# Patient Record
Sex: Female | Born: 2000 | Race: White | Hispanic: No | Marital: Single | Smoking: Never smoker
Health system: Southern US, Community
[De-identification: ages and names within clinical notes are randomized; demographics above are authoritative.]

## PROBLEM LIST (undated history)

## (undated) DIAGNOSIS — N390 Urinary tract infection, site not specified: Secondary | ICD-10-CM

## (undated) DIAGNOSIS — F988 Other specified behavioral and emotional disorders with onset usually occurring in childhood and adolescence: Secondary | ICD-10-CM

## (undated) DIAGNOSIS — F909 Attention-deficit hyperactivity disorder, unspecified type: Secondary | ICD-10-CM

## (undated) HISTORY — PX: KNEE SURGERY: SHX244

## (undated) HISTORY — DX: Other specified behavioral and emotional disorders with onset usually occurring in childhood and adolescence: F98.8

## (undated) HISTORY — PX: TONSILLECTOMY: SUR1361

---
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
## 2019-02-16 ENCOUNTER — Encounter: Payer: Self-pay | Admitting: *Deleted

## 2019-02-17 ENCOUNTER — Ambulatory Visit (INDEPENDENT_AMBULATORY_CARE_PROVIDER_SITE_OTHER): Payer: Managed Care, Other (non HMO) | Admitting: Advanced Practice Midwife

## 2019-02-17 ENCOUNTER — Other Ambulatory Visit: Payer: Self-pay

## 2019-02-17 ENCOUNTER — Other Ambulatory Visit: Payer: Managed Care, Other (non HMO)

## 2019-02-17 ENCOUNTER — Encounter: Payer: Self-pay | Admitting: *Deleted

## 2019-02-17 ENCOUNTER — Other Ambulatory Visit (HOSPITAL_COMMUNITY)
Admission: RE | Admit: 2019-02-17 | Discharge: 2019-02-17 | Disposition: A | Discharge status: Home / Self Care | Payer: Managed Care, Other (non HMO) | Source: Ambulatory Visit | Attending: Advanced Practice Midwife | Admitting: Advanced Practice Midwife

## 2019-02-17 ENCOUNTER — Encounter: Payer: Self-pay | Admitting: Advanced Practice Midwife

## 2019-02-17 VITALS — BP 92/62 | HR 78 | Temp 98.5°F | Wt 135.0 lb

## 2019-02-17 DIAGNOSIS — Z3481 Encounter for supervision of other normal pregnancy, first trimester: Secondary | ICD-10-CM

## 2019-02-17 DIAGNOSIS — Z3A12 12 weeks gestation of pregnancy: Secondary | ICD-10-CM

## 2019-02-17 DIAGNOSIS — O99611 Diseases of the digestive system complicating pregnancy, first trimester: Secondary | ICD-10-CM

## 2019-02-17 DIAGNOSIS — O219 Vomiting of pregnancy, unspecified: Secondary | ICD-10-CM | POA: Diagnosis not present

## 2019-02-17 DIAGNOSIS — Z362 Encounter for other antenatal screening follow-up: Secondary | ICD-10-CM

## 2019-02-17 DIAGNOSIS — Z3401 Encounter for supervision of normal first pregnancy, first trimester: Secondary | ICD-10-CM | POA: Insufficient documentation

## 2019-02-17 DIAGNOSIS — K5900 Constipation, unspecified: Secondary | ICD-10-CM

## 2019-02-17 DIAGNOSIS — Z3400 Encounter for supervision of normal first pregnancy, unspecified trimester: Secondary | ICD-10-CM | POA: Insufficient documentation

## 2019-02-17 MED ORDER — POLYETHYLENE GLYCOL 3350 17 GM/SCOOP PO POWD: 17.0000 g | Freq: Every day | ORAL | 0 refills | Status: DC

## 2019-02-17 MED ORDER — DOCUSATE SODIUM 100 MG PO CAPS: 100.0000 mg | ORAL_CAPSULE | Freq: Every day | ORAL | 2 refills | Status: DC | PRN

## 2019-02-17 MED ORDER — METOCLOPRAMIDE HCL 10 MG PO TABS: 10.0000 mg | ORAL_TABLET | Freq: Four times a day (QID) | ORAL | 2 refills | Status: DC | PRN

## 2019-02-17 NOTE — Patient Instructions (Signed)
Safe Medications in Pregnancy   Acne: Benzoyl Peroxide Salicylic Acid  Backache/Headache: Tylenol: 2 regular strength every 4 hours OR              2 Extra strength every 6 hours  Colds/Coughs/Allergies: Benadryl (alcohol free) 25 mg every 6 hours as needed Breath right strips Claritin Cepacol throat lozenges Chloraseptic throat spray Cold-Eeze- up to three times per day Cough drops, alcohol free Flonase (by prescription only) Guaifenesin Mucinex Robitussin DM (plain only, alcohol free) Saline nasal spray/drops Sudafed (pseudoephedrine) & Actifed ** use only after [redacted] weeks gestation and if you do not have high blood pressure Tylenol Vicks Vaporub Zinc lozenges Zyrtec   Constipation: Colace Ducolax suppositories Fleet enema Glycerin suppositories Metamucil Milk of magnesia Miralax Senokot Smooth move tea  Diarrhea: Kaopectate Imodium A-D  *NO pepto Bismol  Hemorrhoids: Anusol Anusol HC Preparation H Tucks  Indigestion: Tums Maalox Mylanta Zantac  Pepcid  Insomnia: Benadryl (alcohol free) 25mg  every 6 hours as needed Tylenol PM Unisom, no Gelcaps  Leg Cramps: Tums MagGel  Nausea/Vomiting:  Bonine Dramamine Emetrol Ginger extract Sea bands Meclizine  Nausea medication to take during pregnancy:  Unisom (doxylamine succinate 25 mg tablets) Take one tablet daily at bedtime. If symptoms are not adequately controlled, the dose can be increased to a maximum recommended dose of two tablets daily (1/2 tablet in the morning, 1/2 tablet mid-afternoon and one at bedtime). Vitamin B6 100mg  tablets. Take one tablet twice a day (up to 200 mg per day).  Skin Rashes: Aveeno products Benadryl cream or 25mg  every 6 hours as needed Calamine Lotion 1% cortisone cream  Yeast infection: Gyne-lotrimin 7 Monistat 7   **If taking multiple medications, please check labels to avoid duplicating the same active ingredients **take medication as directed on  the label ** Do not exceed 4000 mg of tylenol in 24 hours **Do not take medications that contain aspirin or ibuprofen   First Trimester of Pregnancy The first trimester of pregnancy is from week 1 until the end of week 13 (months 1 through 3). A week after a sperm fertilizes an egg, the egg will implant on the wall of the uterus. This embryo will begin to develop into a baby. Genes from you and your partner will form the baby. The female genes will determine whether the baby will be a boy or a girl. At 6-8 weeks, the eyes and face will be formed, and the heartbeat can be seen on ultrasound. At the end of 12 weeks, all the baby's organs will be formed. Now that you are pregnant, you will want to do everything you can to have a healthy baby. Two of the most important things are to get good prenatal care and to follow your health care provider's instructions. Prenatal care is all the medical care you receive before the baby's birth. This care will help prevent, find, and treat any problems during the pregnancy and childbirth. Body changes during your first trimester Your body goes through many changes during pregnancy. The changes vary from woman to woman.  You may gain or lose a couple of pounds at first.  You may feel sick to your stomach (nauseous) and you may throw up (vomit). If the vomiting is uncontrollable, call your health care provider.  You may tire easily.  You may develop headaches that can be relieved by medicines. All medicines should be approved by your health care provider.  You may urinate more often. Painful urination may mean you have a bladder  a bladder infection. °· You may develop heartburn as a result of your pregnancy. °· You may develop constipation because certain hormones are causing the muscles that push stool through your intestines to slow down. °· You may develop hemorrhoids or swollen veins (varicose veins). °· Your breasts may begin to grow larger and become tender. Your  nipples may stick out more, and the tissue that surrounds them (areola) may become darker. °· Your gums may bleed and may be sensitive to brushing and flossing. °· Dark spots or blotches (chloasma, mask of pregnancy) may develop on your face. This will likely fade after the baby is born. °· Your menstrual periods will stop. °· You may have a loss of appetite. °· You may develop cravings for certain kinds of food. °· You may have changes in your emotions from day to day, such as being excited to be pregnant or being concerned that something may go wrong with the pregnancy and baby. °· You may have more vivid and strange dreams. °· You may have changes in your hair. These can include thickening of your hair, rapid growth, and changes in texture. Some women also have hair loss during or after pregnancy, or hair that feels dry or thin. Your hair will most likely return to normal after your baby is born. °What to expect at prenatal visits °During a routine prenatal visit: °· You will be weighed to make sure you and the baby are growing normally. °· Your blood pressure will be taken. °· Your abdomen will be measured to track your baby's growth. °· The fetal heartbeat will be listened to between weeks 10 and 14 of your pregnancy. °· Test results from any previous visits will be discussed. °Your health care provider may ask you: °· How you are feeling. °· If you are feeling the baby move. °· If you have had any abnormal symptoms, such as leaking fluid, bleeding, severe headaches, or abdominal cramping. °· If you are using any tobacco products, including cigarettes, chewing tobacco, and electronic cigarettes. °· If you have any questions. °Other tests that may be performed during your first trimester include: °· Blood tests to find your blood type and to check for the presence of any previous infections. The tests will also be used to check for low iron levels (anemia) and protein on red blood cells (Rh antibodies).  Depending on your risk factors, or if you previously had diabetes during pregnancy, you may have tests to check for high blood sugar that affects pregnant women (gestational diabetes). °· Urine tests to check for infections, diabetes, or protein in the urine. °· An ultrasound to confirm the proper growth and development of the baby. °· Fetal screens for spinal cord problems (spina bifida) and Down syndrome. °· HIV (human immunodeficiency virus) testing. Routine prenatal testing includes screening for HIV, unless you choose not to have this test. °· You may need other tests to make sure you and the baby are doing well. °Follow these instructions at home: °Medicines °· Follow your health care provider's instructions regarding medicine use. Specific medicines may be either safe or unsafe to take during pregnancy. °· Take a prenatal vitamin that contains at least 600 micrograms (mcg) of folic acid. °· If you develop constipation, try taking a stool softener if your health care provider approves. °Eating and drinking ° °· Eat a balanced diet that includes fresh fruits and vegetables, whole grains, good sources of protein such as meat, eggs, or tofu, and low-fat dairy. Your health   will help you determine the amount of weight gain that is right for you.  Avoid raw meat and uncooked cheese. These carry germs that can cause birth defects in the baby.  Eating four or five small meals rather than three large meals a day may help relieve nausea and vomiting. If you start to feel nauseous, eating a few soda crackers can be helpful. Drinking liquids between meals, instead of during meals, also seems to help ease nausea and vomiting.  Limit foods that are high in fat and processed sugars, such as fried and sweet foods.  To prevent constipation: ? Eat foods that are high in fiber, such as fresh fruits and vegetables, whole grains, and beans. ? Drink enough fluid to keep your urine clear or pale yellow. Activity   Exercise only as directed by your health care provider. Most women can continue their usual exercise routine during pregnancy. Try to exercise for 30 minutes at least 5 days a week. Exercising will help you: ? Control your weight. ? Stay in shape. ? Be prepared for labor and delivery.  Experiencing pain or cramping in the lower abdomen or lower back is a good sign that you should stop exercising. Check with your health care provider before continuing with normal exercises.  Try to avoid standing for long periods of time. Move your legs often if you must stand in one place for a long time.  Avoid heavy lifting.  Wear low-heeled shoes and practice good posture.  You may continue to have sex unless your health care provider tells you not to. Relieving pain and discomfort  Wear a good support bra to relieve breast tenderness.  Take warm sitz baths to soothe any pain or discomfort caused by hemorrhoids. Use hemorrhoid cream if your health care provider approves.  Rest with your legs elevated if you have leg cramps or low back pain.  If you develop varicose veins in your legs, wear support hose. Elevate your feet for 15 minutes, 3-4 times a day. Limit salt in your diet. Prenatal care  Schedule your prenatal visits by the twelfth week of pregnancy. They are usually scheduled monthly at first, then more often in the last 2 months before delivery.  Write down your questions. Take them to your prenatal visits.  Keep all your prenatal visits as told by your health care provider. This is important. Safety  Wear your seat belt at all times when driving.  Make a list of emergency phone numbers, including numbers for family, friends, the hospital, and police and fire departments. General instructions  Ask your health care provider for a referral to a local prenatal education class. Begin classes no later than the beginning of month 6 of your pregnancy.  Ask for help if you have counseling or  nutritional needs during pregnancy. Your health care provider can offer advice or refer you to specialists for help with various needs.  Do not use hot tubs, steam rooms, or saunas.  Do not douche or use tampons or scented sanitary pads.  Do not cross your legs for long periods of time.  Avoid cat litter boxes and soil used by cats. These carry germs that can cause birth defects in the baby and possibly loss of the fetus by miscarriage or stillbirth.  Avoid all smoking, herbs, alcohol, and medicines not prescribed by your health care provider. Chemicals in these products affect the formation and growth of the baby.  Do not use any products that contain nicotine or tobacco,  such as cigarettes and e-cigarettes. If you need help quitting, ask your health care provider. You may receive counseling support and other resources to help you quit.  Schedule a dentist appointment. At home, brush your teeth with a soft toothbrush and be gentle when you floss. Contact a health care provider if:  You have dizziness.  You have mild pelvic cramps, pelvic pressure, or nagging pain in the abdominal area.  You have persistent nausea, vomiting, or diarrhea.  You have a bad smelling vaginal discharge.  You have pain when you urinate.  You notice increased swelling in your face, hands, legs, or ankles.  You are exposed to fifth disease or chickenpox.  You are exposed to [place] measles (rubella) and have never had it. Get help right away if:  You have a fever.  You are leaking fluid from your vagina.  You have spotting or bleeding from your vagina.  You have severe abdominal cramping or pain.  You have rapid weight gain or loss.  You vomit blood or material that looks like coffee grounds.  You develop a severe headache.  You have shortness of breath.  You have any kind of trauma, such as from a fall or a car accident. Summary  The first trimester of pregnancy is from week 1 until the  end of week 13 (months 1 through 3).  Your body goes through many changes during pregnancy. The changes vary from woman to woman.  You will have routine prenatal visits. During those visits, your health care provider will examine you, discuss any test results you may have, and talk with you about how you are feeling. This information is not intended to replace advice given to you by your health care provider. Make sure you discuss any questions you have with your health care provider. Document Released: 03/13/2001 Document Revised: 03/01/2017 Document Reviewed: 02/29/2016 Elsevier Patient Education  2020 [employer]

## 2019-02-17 NOTE — Progress Notes (Signed)
Bedside U/S shows single IUP with FHT of 160 BPM and HC measures 6.84cm  GA [redacted]W[redacted]D

## 2019-02-17 NOTE — Progress Notes (Signed)
Subjective:   Donna Huang is a 18 y.o. G1P0 at [redacted]w[redacted]d by 10week [place], being seen today for her first obstetrical visit.  Patient has no obsterical history and  has Patellar dislocation; Attention deficit disorder (ADD) without hyperactivity; Dysmenorrhea; and Supervision of normal first pregnancy on their problem list. Pregnancy history fully reviewed  Today patient reports doing well over all but with some intermittent nausea and vomiting that are improving at this time. She is able to take her prenatal vitamins without difficulty but would like some PRN Rx antiemetic.  Patient also reports mild pruritus around her left nipple and areola with redness when itching but without warmth, discharge, pain, fever, or a lump.      Patient Active Problem List   Diagnosis Date Noted  . Supervision of normal first pregnancy 02/17/2019  . 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 Dysmenorrhea 02/10/2016  . Patellar dislocation 03/04/2014    HISTORY: OB History  Gravida Para Term Preterm AB Living  1 0 0 0 0 0  SAB TAB Ectopic Multiple Live Births  0 0 0 0 0    # Outcome Date GA Lbr Len/2nd Weight Sex Delivery Anes PTL Lv  1 Current            Past Medical History:  Diagnosis Date  . ADD (attention deficit disorder)   . ADHD (attention deficit hyperactivity disorder)    Past Surgical History:  Procedure Laterality Date  . KNEE SURGERY Left   . TONSILLECTOMY     History reviewed. No pertinent family history. Social History   Tobacco Use  . Smoking status: Never Smoker  . Smokeless tobacco: Never Used  Substance Use Topics  . Alcohol use: No  . Drug use: No   No Known Allergies Current Outpatient Medications on File Prior to Visit  Medication Sig Dispense Refill  . Prenatal Vit-Fe Fumarate-FA (PRENATAL VITAMINS PO) Take 1 capsule by mouth daily.     No current facility-administered medications on file prior to visit.     Indications for ASA  therapy (per uptodate) : Patient has no indications One of the following: Previous pregnancy with preeclampsia, especially early onset and with an adverse outcome No Multifetal gestation No Chronic hypertension No Type 1 or 2 diabetes mellitus No Chronic kidney disease No Autoimmune disease (antiphospholipid syndrome, systemic lupus erythematosus) No  Two or more of the following: Nulliparity No Obesity (body mass index >30 kg/m2) No Family history of preeclampsia in mother or sister No Age ?35 years No Sociodemographic characteristics (African American race, low socioeconomic level) No Personal risk factors (eg, previous pregnancy with low birth weight or small for gestational age infant, previous adverse pregnancy outcome [eg, stillbirth], interval >10 years between pregnancies) No  Indications for early 1 hour GTT (per uptodate)  BMI >25 (>23 in Asian women) AND one of the following  Gestational diabetes mellitus in a previous pregnancy No Glycated hemoglobin ?5.7 percent (39 mmol/mol), impaired glucose tolerance, or impaired fasting glucose on previous testing No First-degree relative with diabetes No High-risk race/ethnicity (eg, African American, Latino, Native Amerdfsfeican, Asian American, Pacific Islander) No History of cardiovascular disease No Hypertension or on therapy for hypertension No High-density lipoprotein cholesterol level <35 mg/dL (0.90 mmol/L) and/or a triglyceride level >250 mg/dL (2.82 mmol/L) No Polycystic ovary syndrome No Physical inactivity No Other clinical condition associated with insulin resistance (eg, severe obesity, acanthosis nigricans) No Previous birth of an infant weighing ?4000 g No Previous stillbirth of unknown  cause No Exam   Vitals:   02/17/19 0814  BP: 92/62  Pulse: 78  Temp: 98.5 F (36.9 C)  Weight: 61.2 kg   Fetal Heart Rate (bpm): 160  System: General: well-developed, well-nourished female in no acute distress   Breast:   normal appearance, no masses or tenderness   Skin: normal coloration and turgor, no rashes   Neurologic: oriented, normal, negative, normal mood   Extremities: normal strength, tone, and muscle mass, ROM of all joints is normal   HEENT PERRLA, extraocular movement intact and sclera clear, anicteric   Mouth/Teeth mucous membranes moist, pharynx normal without lesions and dental hygiene good   Neck supple and no masses   Cardiovascular: regular rate and rhythm   Respiratory:  no respiratory distress, normal breath sounds   Abdomen: soft, non-tender; bowel sounds normal; no masses,  no organomegaly     Assessment:   Pregnancy: G1P0 Patient Active Problem List   Diagnosis Date Noted  . Supervision of normal first pregnancy 02/17/2019  . 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 Dysmenorrhea 02/10/2016  . Patellar dislocation 03/04/2014     Plan:  1. Encounter for supervision of normal first pregnancy in first trimester - Obstetric panel - HIV antibody (with reflex) - Culture, OB Urine - GC/Chlamydia probe amp ([place])not at [facility] - [place] bedside; Future - Babyscripts Schedule Optimization - Panorama or Harmony  2. Nausea and vomiting during pregnancy prior to [redacted] weeks gestation Improving per patient, no symptoms today. - metoCLOPramide (REGLAN) 10 MG tablet; Take 1 tablet (10 mg total) by mouth every 6 (six) hours as needed for nausea.  Dispense: 30 tablet; Refill: 2  3. Constipation during pregnancy in first trimester Discussed appropriate diet changes such as increasing water and natural fiber intake.  - polyethylene glycol powder (GLYCOLAX/MIRALAX) 17 GM/SCOOP powder; Take 17 g by mouth daily.  Dispense: 255 g; Refill: 0 - docusate sodium (COLACE) 100 MG capsule; Take 1 capsule (100 mg total) by mouth daily as needed.  Dispense: 30 capsule; Refill: 2  4. Nipple and areola pruritus, Left Most likely due to first trimester pregnancy vascular  change. Patient educated and encouraged to use coconut oil/milk.   Initial labs drawn. Continue prenatal vitamins. Discussed and offered genetic screening options, including Quad screen/AFP, NIPS testing, and option to decline testing. Benefits/risks/alternatives reviewed. Pt aware that anatomy US is form of genetic screening with lower accuracy in detecting trisomies than blood work.  Pt declines genetic screening today : NIPS ordered today Ultrasound discussed; fetal anatomic survey: requested. Problem list reviewed and updated. The nature of [facility] with multiple MDs and other Advanced Practice Providers was explained to patient; also emphasized that residents, students are part of our team. Routine obstetric precautions reviewed. Return in about 4 weeks (around 03/17/2019).   Eduard Clos, Student-PA 02/17/19 9:41 AM

## 2019-02-18 LAB — OBSTETRIC PANEL
Absolute Monocytes: 331 cells/uL (ref 200–950)
Antibody Screen: NOT DETECTED
Basophils Absolute: 31 cells/uL (ref 0–200)
Basophils Relative: 0.4 %
Eosinophils Absolute: 62 cells/uL (ref 15–500)
Eosinophils Relative: 0.8 %
HCT: 38.2 % (ref 35.0–45.0)
Hemoglobin: 12.9 g/dL (ref 11.7–15.5)
Hepatitis B Surface Ag: NONREACTIVE
Lymphs Abs: 1448 cells/uL (ref 850–3900)
MCH: 32.7 pg (ref 27.0–33.0)
MCHC: 33.8 g/dL (ref 32.0–36.0)
MCV: 97 fL (ref 80.0–100.0)
MPV: 12 fL (ref 7.5–12.5)
Monocytes Relative: 4.3 %
Neutro Abs: 5829 cells/uL (ref 1500–7800)
Neutrophils Relative %: 75.7 %
Platelets: 181 10*3/uL (ref 140–400)
RBC: 3.94 10*6/uL (ref 3.80–5.10)
RDW: 12 % (ref 11.0–15.0)
RPR Ser Ql: NONREACTIVE
Rubella: <0.9 index — ABNORMAL LOW
Total Lymphocyte: 18.8 %
WBC: 7.7 10*3/uL (ref 3.8–10.8)

## 2019-02-18 LAB — GC/CHLAMYDIA PROBE AMP (~~LOC~~) NOT AT ARMC
Chlamydia: NEGATIVE
Comment: NEGATIVE
Comment: NORMAL
Neisseria Gonorrhea: NEGATIVE

## 2019-02-18 LAB — HIV ANTIBODY (ROUTINE TESTING W REFLEX): HIV 1&2 Ab, 4th Generation: NONREACTIVE

## 2019-02-19 LAB — URINE CULTURE, OB REFLEX: Organism ID, Bacteria: NO GROWTH

## 2019-02-19 LAB — CULTURE, OB URINE

## 2019-03-02 ENCOUNTER — Other Ambulatory Visit (INDEPENDENT_AMBULATORY_CARE_PROVIDER_SITE_OTHER): Payer: Managed Care, Other (non HMO)

## 2019-03-02 ENCOUNTER — Other Ambulatory Visit: Payer: Self-pay

## 2019-03-02 ENCOUNTER — Encounter: Payer: Self-pay | Admitting: *Deleted

## 2019-03-02 VITALS — BP 91/63 | HR 73 | Temp 98.6°F | Resp 16 | Ht 63.0 in | Wt 141.0 lb

## 2019-03-02 DIAGNOSIS — R300 Dysuria: Secondary | ICD-10-CM

## 2019-03-02 NOTE — Progress Notes (Signed)
SUBJECTIVE: Donna Huang is a 18 y.o. female who complains of urinary frequency, urgency and dysuria x 5 days, without flank pain, fever, chills, or abnormal vaginal discharge or bleeding.   OBJECTIVE: Appears well, in no apparent distress.  Vital signs are normal. Urine dipstick shows negative for all components  Pt is [redacted] weeks pregnant and states that she is having lower back pain with lower bilateral pelvic pain.  Pt's urine is neg but will send for culture.  I have suggested her getting a belly band and wear during the day.  She may be experiencing ligament pain and as the uterus grows it puts pressure on her bladder..    ASSESSMENT: Dysuria  PLAN: Treatment per orders.  Call or return to clinic prn if these symptoms worsen or fail to improve as anticipated.

## 2019-03-04 LAB — URINE CULTURE, OB REFLEX: Organism ID, Bacteria: NO GROWTH

## 2019-03-04 LAB — CULTURE, OB URINE

## 2019-03-17 ENCOUNTER — Ambulatory Visit (INDEPENDENT_AMBULATORY_CARE_PROVIDER_SITE_OTHER): Payer: Managed Care, Other (non HMO) | Admitting: Advanced Practice Midwife

## 2019-03-17 ENCOUNTER — Other Ambulatory Visit: Payer: Self-pay

## 2019-03-17 VITALS — BP 95/57 | HR 90 | Temp 98.6°F | Wt 139.0 lb

## 2019-03-17 DIAGNOSIS — Z3A16 16 weeks gestation of pregnancy: Secondary | ICD-10-CM

## 2019-03-17 DIAGNOSIS — Z3A19 19 weeks gestation of pregnancy: Secondary | ICD-10-CM

## 2019-03-17 DIAGNOSIS — Z3402 Encounter for supervision of normal first pregnancy, second trimester: Secondary | ICD-10-CM | POA: Diagnosis not present

## 2019-03-17 NOTE — Progress Notes (Signed)
   PRENATAL VISIT NOTE  Subjective:  Donna Huang is a 18 y.o. G1P0 at [redacted]w[redacted]d being seen today for ongoing prenatal care.  She is currently monitored for the following issues for this low-risk pregnancy and has Patellar dislocation; Attention deficit disorder (ADD) without hyperactivity; Dysmenorrhea; and Supervision of normal first pregnancy on their problem list.  Patient reports some nausea, however improving with medications. She does have questions about COVID vaccine and the safety of it during pregnancy. 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 Vag. Bleeding: None Movement: Absent. Denies leaking of fluid.   The following portions of the patient's history were reviewed and updated as appropriate: allergies, current medications, past family history, past medical history, past social history, past surgical history and problem list.   Objective:   Vitals:   03/17/19 0814  BP: (!) 95/57  Pulse: 90  Temp: 98.6 F (37 C)  Weight: 63 kg   Fetal Status: Fetal Heart Rate (bpm): 146   Movement: Absent     General:  Alert, oriented and cooperative. Patient is in no acute distress.  Skin: Skin is warm and dry. No rash noted.   Cardiovascular: Normal heart rate noted  Respiratory: Normal respiratory effort, no problems with respiration noted  Abdomen: Soft, gravid, appropriate for gestational age.        Pelvic: Cervical exam deferred        Extremities: Normal range of motion.  Edema: None  Mental Status: Normal mood and affect. Normal behavior. Normal judgment and thought content.   Assessment and Plan:  Pregnancy: G1P0 at [redacted]w[redacted]d 1. Encounter for supervision of normal first pregnancy in second trimester - Routine prenatal care - Pt doing well overall. Still continues to have nausea, but improving with medication - Discussed recent ACOG recommendations of not withholding COVID vaccine during pregnancy, however given there is no safety  data on specific use during pregnancy, shared decision making would be used between both provider and patient. Reassured patient that it is perfectly reasonable to decline COVID vaccine during pregnancy.   - AFP today. - Anatomy ultrasound ordered   - AFP only - [place] MFM OB COMP + 14 WK; Future  Return in about 4 weeks (around 04/14/2019).   Preterm labor symptoms and general obstetric precautions including but not limited to vaginal bleeding, contractions, leaking of fluid and fetal movement were reviewed in detail with the patient. Please refer to After Visit Summary for other counseling recommendations.    Maryagnes Amos, SNM

## 2019-03-18 LAB — ALPHA FETOPROTEIN, MATERNAL
AFP MoM: 0.99
AFP, Serum: 35.1 ng/mL
Calc'd Gestational Age: 16.1 weeks
Maternal Wt: 139 [lb_av]
Risk for ONTD: <1
Twins-AFP: 1

## 2019-03-31 ENCOUNTER — Telehealth: Payer: Self-pay

## 2019-03-31 NOTE — Telephone Encounter (Signed)
Pt states she is having pain lower, right side of her back. Pt states sometimes it is throbbing pain and sometimes it is sharp pain. Pt denies any UTI symptoms. Pt instructed to drink plenty of water, take Tylenol, rest and use heating pads to relieve pain. Pt was instructed to go to MAU if pain gets worse or it is associated with nausea vomiting, etc. Pt expressed understanding.

## 2019-04-03 NOTE — L&D Delivery Note (Addendum)
OB/GYN Faculty Practice Delivery Note  Donna Huang is a 19 y.o. G1P0 s/p vaginal delivery at [redacted]w[redacted]d. She was admitted for IOL 2/2 post-dates.   ROM: 5h 40m with clear fluid GBS Status: Negative Maximum Maternal Temperature: 99.2  Labor Progress: . Initially given dose of cytotec and then started on pitocin.  After >12 hours on pitocin, she was no progressing, so pitocin was stopped and was given 3 additional doses of cytotec.  She was then restarted on pitocin and began to progress.  She SROMed at 0748 on 09/03/2019.  Continued to progress to complete without complication.  Delivery Date/Time: 09/03/2019 at 1334 Delivery: Called to room and patient was complete and pushing. Head delivered LOA. No nuchal cord present. Shoulder and body delivered in usual fashion. Infant with spontaneous cry, placed on mother's abdomen, dried and stimulated. Cord clamped x 2 after 1-minute delay, and cut by FOB under my direct supervision. Cord blood drawn. Placenta delivered spontaneously with gentle cord traction. Fundus firm with massage and Pitocin. Labia, perineum, vagina, and cervix were inspected, noted to have a left labial laceration that was repaired with 3-0 Vicryl in a running fashion.   Placenta: Intact, 3 vessel cord Complications: None Lacerations: Left labial QBL: 293 cc Analgesia: Epidural  Infant: Viable  APGARs 7/9  per nursing documentation   Donna Genene Churn, MD PGY-2 Resident, Family Medicine 09/03/2019, 2:04 PM   OB FELLOW DELIVERY ATTESTATION  I was gloved and present for the delivery in its entirety, and I agree with the above resident's note.    Donna Birkenhead, MD [facility] Family Medicine Fellow, [facility] for [employer], [facility]

## 2019-04-10 ENCOUNTER — Ambulatory Visit (HOSPITAL_COMMUNITY): Payer: Managed Care, Other (non HMO)

## 2019-04-14 ENCOUNTER — Telehealth (INDEPENDENT_AMBULATORY_CARE_PROVIDER_SITE_OTHER): Payer: Managed Care, Other (non HMO) | Admitting: Advanced Practice Midwife

## 2019-04-14 DIAGNOSIS — O99891 Other specified diseases and conditions complicating pregnancy: Secondary | ICD-10-CM

## 2019-04-14 DIAGNOSIS — Z3402 Encounter for supervision of normal first pregnancy, second trimester: Secondary | ICD-10-CM

## 2019-04-14 DIAGNOSIS — Z3A20 20 weeks gestation of pregnancy: Secondary | ICD-10-CM

## 2019-04-14 DIAGNOSIS — M549 Dorsalgia, unspecified: Secondary | ICD-10-CM

## 2019-04-14 DIAGNOSIS — O99892 Other specified diseases and conditions complicating childbirth: Secondary | ICD-10-CM

## 2019-04-14 MED ORDER — COMFORT FIT MATERNITY SUPP MED MISC: 1.0000 | Freq: Every day | 0 refills | Status: DC

## 2019-04-14 NOTE — Progress Notes (Signed)
   TELEHEALTH OBSTETRICS PRENATAL VIRTUAL VIDEO VISIT ENCOUNTER NOTE  Provider location: Center for [employer] at [place]   I connected with [name] on 04/14/19 at 10:55 AM EST by MyChart Video Encounter at home and verified that I am speaking with the correct person using two identifiers.   I discussed the limitations, risks, security and privacy concerns of performing an evaluation and management service virtually and the availability of in person appointments. I also discussed with the patient that there may be a patient responsible charge related to this service. The patient expressed understanding and agreed to proceed. Subjective:  Donna Huang is a 19 y.o. G1P0 at [redacted]w[redacted]d being seen today for ongoing prenatal care.  She is currently monitored for the following issues for this low-risk pregnancy and has Patellar dislocation; Attention deficit disorder (ADD) without hyperactivity; Dysmenorrhea; and Supervision of normal first pregnancy on their problem list.  Patient reports backache.   .  .   . Denies any leaking of fluid.   The following portions of the patient's history were reviewed and updated as appropriate: allergies, current medications, past family history, past medical history, past social history, past surgical history and problem list.   Objective:  There were no vitals filed for this visit.  Fetal Status:           General:  Alert, oriented and cooperative. Patient is in no acute distress.  Respiratory: Normal respiratory effort, no problems with respiration noted  Mental Status: Normal mood and affect. Normal behavior. Normal judgment and thought content.  Rest of physical exam deferred due to type of encounter  Imaging: No results found.  Assessment and Plan:  Pregnancy: G1P0 at [redacted]w[redacted]d  1. Back pain affecting pregnancy in second trimester --Pain intermittent, not daily --Rest/ice/heat/warm bath/Tylenol/pregnancy support belt --Rx for support belt  faxed to [facility] --Pt to come today for lab only visit to leave urine for culture  2. Encounter for supervision of normal first pregnancy in second trimester --Anticipatory guidance about next visits/weeks of pregnancy given. --Next visit in 2 weeks virtual  Preterm labor symptoms and general obstetric precautions including but not limited to vaginal bleeding, contractions, leaking of fluid and fetal movement were reviewed in detail with the patient. I discussed the assessment and treatment plan with the patient. The patient was provided an opportunity to ask questions and all were answered. The patient agreed with the plan and demonstrated an understanding of the instructions. The patient was advised to call back or seek an in-person office evaluation/go to [facility]U at [facility] for any urgent or concerning symptoms. Please refer to After Visit Summary for other counseling recommendations.   I provided 10 minutes of face-to-face time during this encounter.  No follow-ups on file.  Future Appointments  Date Time Provider Department Center  04/17/2019  2:45 PM [facility] [place] 2 [facility]  04/28/2019  1:30 PM Leftwich-Kirby, Wilmer Floor, CNM [facility]    Sharen Counter, CNM [facility] [employer], [facility]

## 2019-04-14 NOTE — Progress Notes (Signed)
Pt will take BP today and enter it in to BS. Discussed with pt the importance of BS and entering BP and weight once a week

## 2019-04-15 ENCOUNTER — Other Ambulatory Visit: Payer: Self-pay

## 2019-04-15 ENCOUNTER — Other Ambulatory Visit (INDEPENDENT_AMBULATORY_CARE_PROVIDER_SITE_OTHER): Payer: Managed Care, Other (non HMO)

## 2019-04-15 DIAGNOSIS — M549 Dorsalgia, unspecified: Secondary | ICD-10-CM

## 2019-04-15 DIAGNOSIS — O99891 Other specified diseases and conditions complicating pregnancy: Secondary | ICD-10-CM

## 2019-04-15 NOTE — Progress Notes (Signed)
Pt here for urine culture per Sharen Counter, CNM

## 2019-04-16 ENCOUNTER — Telehealth: Payer: Self-pay | Admitting: *Deleted

## 2019-04-16 NOTE — Telephone Encounter (Signed)
Left patient a message to pick up prescription for maternity band.

## 2019-04-17 ENCOUNTER — Ambulatory Visit (HOSPITAL_COMMUNITY)
Admission: RE | Admit: 2019-04-17 | Discharge: 2019-04-17 | Disposition: A | Discharge status: Home / Self Care | Payer: Managed Care, Other (non HMO) | Source: Ambulatory Visit | Attending: Obstetrics and Gynecology | Admitting: Obstetrics and Gynecology

## 2019-04-17 ENCOUNTER — Other Ambulatory Visit: Payer: Self-pay

## 2019-04-17 DIAGNOSIS — Z3402 Encounter for supervision of normal first pregnancy, second trimester: Secondary | ICD-10-CM | POA: Insufficient documentation

## 2019-04-17 DIAGNOSIS — Z3A20 20 weeks gestation of pregnancy: Secondary | ICD-10-CM | POA: Diagnosis not present

## 2019-04-17 DIAGNOSIS — Z363 Encounter for antenatal screening for malformations: Secondary | ICD-10-CM

## 2019-04-17 LAB — URINE CULTURE, OB REFLEX

## 2019-04-17 LAB — CULTURE, OB URINE

## 2019-04-28 ENCOUNTER — Telehealth (INDEPENDENT_AMBULATORY_CARE_PROVIDER_SITE_OTHER): Payer: Managed Care, Other (non HMO) | Admitting: Advanced Practice Midwife

## 2019-04-28 VITALS — BP 127/78

## 2019-04-28 DIAGNOSIS — Z3A22 22 weeks gestation of pregnancy: Secondary | ICD-10-CM

## 2019-04-28 DIAGNOSIS — O99891 Other specified diseases and conditions complicating pregnancy: Secondary | ICD-10-CM

## 2019-04-28 DIAGNOSIS — Z3402 Encounter for supervision of normal first pregnancy, second trimester: Secondary | ICD-10-CM

## 2019-04-28 DIAGNOSIS — M549 Dorsalgia, unspecified: Secondary | ICD-10-CM

## 2019-04-28 NOTE — Progress Notes (Signed)
TELEHEALTH OBSTETRICS PRENATAL VIRTUAL VIDEO VISIT ENCOUNTER NOTE  Provider location: [facility] [place]   I connected with Donna Huang on 04/28/19 at  1:30 PM EST by MyChart Video Encounter at home and verified that I am speaking with the correct person using two identifiers.   I discussed the limitations, risks, security and privacy concerns of performing an evaluation and management service virtually and the availability of in person appointments. I also discussed with the patient that there may be a patient responsible charge related to this service. The patient expressed understanding and agreed to proceed. Subjective:  Donna Huang is a 19 y.o. G1P0 at [redacted]w[redacted]d being seen today for ongoing prenatal care.  She is currently monitored for the following issues for this low-risk pregnancy and has Patellar dislocation; Attention deficit disorder (ADD) without hyperactivity; Dysmenorrhea; and Supervision of normal first pregnancy on their problem list.  Patient reports backache.  Contractions: Not present. Vag. Bleeding: None.  Movement: Present. Denies any leaking of fluid.   The following portions of the patient's history were reviewed and updated as appropriate: allergies, current medications, past family history, past medical history, past social history, past surgical history and problem list.   Objective:   Vitals:   04/28/19 1314  BP: 127/78    Fetal Status:     Movement: Present     General:  Alert, oriented and cooperative. Patient is in no acute distress.  Respiratory: Normal respiratory effort, no problems with respiration noted  Mental Status: Normal mood and affect. Normal behavior. Normal judgment and thought content.  Rest of physical exam deferred due to type of encounter  Imaging: [place] MFM OB COMP + 14 WK  Result Date: 04/17/2019 ----------------------------------------------------------------------  OBSTETRICS REPORT                        (Signed Final 04/17/2019 04:29 pm) ---------------------------------------------------------------------- Patient Info  ID #:       [id number]                          D.O.B.:  23-Mar-2001 (19 yrs)  Name:       Donna Huang                     Visit Date: 04/17/2019 04:16 pm ---------------------------------------------------------------------- Performed By  Performed By:     Eden Lathe BS      Ref. Address:     [address]                    [facility]                                                             [place], [place]  Attending:        Ma Rings MD         [facility]  Referred By:      Everardo All ---------------------------------------------------------------------- Orders   #  Description                          Code         Ordered By   1  [place] MFM OB COMP + 14 WK               76805.01     Becky Colan LEFTWICH-                                                        KIRBY  ----------------------------------------------------------------------   #  Order #                    Accession #                 Episode #   1  [id number]                  [id number]                  [id number]  ---------------------------------------------------------------------- Indications   Antenatal screening for malformations (low     Z36.3   risk NIPS, neg AFP)   [redacted] weeks gestation of pregnancy                Z3A.20  ---------------------------------------------------------------------- Fetal Evaluation  Num Of Fetuses:         1  Fetal Heart Rate(bpm):  157  Cardiac Activity:       Observed  Presentation:           Cephalic  Placenta:               Anterior  P. Cord Insertion:      Visualized  Amniotic Fluid  AFI FV:      Within normal limits                              Largest Pocket(cm)                              4.28 ---------------------------------------------------------------------- Biometry  BPD:      49.7  mm      G. Age:  21w 0d         69  %    CI:        69.62   %    70 - 86                                                          FL/HC:      17.7   %    15.9 - 20.3  HC:      190.1  mm     G. Age:  21w 2d         73  %    HC/AC:      1.19        1.06 - 1.25  AC:      160.1  mm     G. Age:  21w 1d         62  %    FL/BPD:     67.8   %  FL:       33.7  mm     G. Age:  20w 4d         41  %    FL/AC:      21.0   %    20 - 24  HUM:      30.8  mm     G. Age:  20w 2d         40  %  CER:      22.4  mm     G. Age:  21w 1d         62  %  NFT:       3.9  mm  CM:        4.8  mm  Est. FW:     386  gm    0 lb 14 oz      65  % ---------------------------------------------------------------------- OB History  Gravidity:    1         Term:   0        Prem:   0        SAB:   0  TOP:          0       Ectopic:  0        Living: 0 ---------------------------------------------------------------------- Gestational Age  LMP:           18w 4d        Date:  12/08/18                 EDD:   09/14/19  U/S Today:     21w 0d                                        EDD:   08/28/19  Best:          20w 4d     Det. By:  Marcella Dubs         EDD:   08/31/19                                      (02/07/19) ---------------------------------------------------------------------- Anatomy  Cranium:               Appears normal         Aortic Arch:            Appears normal  Cavum:                 Appears normal         Ductal Arch:            Appears normal  Ventricles:            Appears normal         Diaphragm:              Appears normal  Choroid Plexus:        Appears normal         Stomach:  Appears normal, left                                                                        sided  Cerebellum:            Appears normal         Abdomen:                Appears normal  Posterior Fossa:       Appears normal         Abdominal Wall:         Appears nml (cord                                                                        insert, abd  wall)  Nuchal Fold:           Not applicable (>20    Cord Vessels:           Appears normal ([redacted]                         wks GA)                                        vessel cord)  Face:                  Appears normal         Kidneys:                Appear normal                         (orbits and profile)  Lips:                  Appears normal         Bladder:                Appears normal  Thoracic:              Appears normal         Spine:                  Appears normal  Heart:                 Appears normal         Upper Extremities:      Appears normal                         (4CH, axis, and                         situs)  RVOT:                  Appears normal  Lower Extremities:      Appears normal  LVOT:                  Appears normal  Other:  Heels/feet and hands visualized. Nasal bone visualized. ---------------------------------------------------------------------- Cervix Uterus Adnexa  Cervix  Length:           3.12  cm.  Normal appearance by transabdominal scan.  Uterus  No abnormality visualized.  Left Ovary  Not visualized.  Right Ovary  Not visualized.  Cul De Sac  No free fluid seen.  Adnexa  No abnormality visualized. ---------------------------------------------------------------------- Comments  This patient was seen for a detailed fetal anatomy scan.  She had a cell free DNA test earlier in her pregnancy which  indicated a low risk for trisomy 77, 87, and 13.  The fetal growth and amniotic fluid level were appropriate for  her gestational age.  There were no obvious fetal anomalies noted on today's  ultrasound exam.  Anomalies may be missed due to technical limitations. If the  fetus is in a suboptimal position or maternal habitus is  increased, visualization of the fetus in the maternal uterus  may be impaired.  Follow up as indicated. ----------------------------------------------------------------------                   Johnell Comings, MD Electronically Signed Final Report    04/17/2019 04:29 pm ----------------------------------------------------------------------   Assessment and Plan:  Pregnancy: G1P0 at [redacted]w[redacted]d 1. Encounter for supervision of normal first pregnancy in second trimester --Pt reports good fetal movement, denies cramping, LOF, or vaginal bleeding --Pt BP at home all wnl --Reviewed normal anatomy US --Anticipatory guidance about next visits/weeks of pregnancy given. --Next visit at 27 weeks in office for GTT  2. Back pain affecting pregnancy in second trimester --Pain mostly with working, resolves with rest.  --Discussed options, pt wearing support belt, does not desire medication at this time --Will consider PT and/or muscle relaxers if needed but pt doing well for now  Preterm labor symptoms and general obstetric precautions including but not limited to vaginal bleeding, contractions, leaking of fluid and fetal movement were reviewed in detail with the patient. I discussed the assessment and treatment plan with the patient. The patient was provided an opportunity to ask questions and all were answered. The patient agreed with the plan and demonstrated an understanding of the instructions. The patient was advised to call back or seek an in-person office evaluation/go to [facility] at [facility] for any urgent or concerning symptoms. Please refer to After Visit Summary for other counseling recommendations.   I provided 10 minutes of face-to-face time during this encounter.  Return in about 5 weeks (around 06/02/2019).  Future Appointments  Date Time Provider [facility]  06/05/2019  8:30 AM Nugent, Gerrie Nordmann, NP [facility]    Fatima Blank, [place] for [employer], [place]

## 2019-06-05 ENCOUNTER — Ambulatory Visit (INDEPENDENT_AMBULATORY_CARE_PROVIDER_SITE_OTHER): Payer: Managed Care, Other (non HMO) | Admitting: Women's Health

## 2019-06-05 ENCOUNTER — Other Ambulatory Visit: Payer: Self-pay

## 2019-06-05 VITALS — BP 107/67 | Temp 98.3°F | Wt 160.0 lb

## 2019-06-05 DIAGNOSIS — Z23 Encounter for immunization: Secondary | ICD-10-CM | POA: Diagnosis not present

## 2019-06-05 DIAGNOSIS — O360920 Maternal care for other rhesus isoimmunization, second trimester, not applicable or unspecified: Secondary | ICD-10-CM

## 2019-06-05 DIAGNOSIS — Z3A27 27 weeks gestation of pregnancy: Secondary | ICD-10-CM | POA: Diagnosis not present

## 2019-06-05 DIAGNOSIS — Z3402 Encounter for supervision of normal first pregnancy, second trimester: Secondary | ICD-10-CM

## 2019-06-05 MED ORDER — RHO D IMMUNE GLOBULIN 1500 UNIT/2ML IJ SOSY
300.0000 ug | PREFILLED_SYRINGE | Freq: Once | INTRAMUSCULAR | Status: AC
  Administered 2019-06-05: 300 ug via INTRAMUSCULAR

## 2019-06-05 NOTE — Progress Notes (Signed)
Syncope episodes

## 2019-06-05 NOTE — Patient Instructions (Addendum)
AREA PEDIATRIC/FAMILY PRACTICE PHYSICIANS  [facility] [address] [place], [place] [address] Phone - [phone]   Fax - [phone]  JACK AMOS [address] [place], [place]  [address] Phone - [phone]   Fax - [phone]  [facility] [address] [place], [place]  [address] Phone - [phone]   Fax - [phone]  [facility] [address] [place], [place]  [address] Phone - [phone]   Fax - [phone]  [facility] [address] [place], [place]  [address] Phone - [phone]   Fax - [phone]  [facility] [address] [place], [place]  [address] Phone - [phone]   Fax - [phone]  [facility] [address] [place], [place]  [address] Phone - [phone]   Fax - [phone]  [facility] AT [facility] [address] [place], [address] [place], [place]  [address] Phone - [phone]   Fax - [phone]  [facility] AT [facility] [address] [place], [place]  [address] Phone - [phone]   Fax - [phone] [facility] [place] [address] [place], [place]  [address] Phone - [phone]   Fax - [phone]  [facility] [address] [place], [place]  [address] Phone - [phone]   Fax - [phone]  [facility] [address], [address] [place], [place]  [address] Phone - [phone]   Fax - [phone]  [facility] [address] [place], [place]  [address] Phone - [phone]   Fax - [phone]  [facility] [address] [place], [place]  [address] Phone - [phone]  [facility] [address] [place], [place]  [address] Phone - [phone]   Fax - [phone]  [facility] [address] [place], [place]  [address] Phone - [phone]   Fax - [phone]  [facility] [address] [place], [place]  [address] Phone - [phone]   Fax - [phone]  [facility] [address] [place], [place]  [address] Phone - [phone]   Fax - [phone]   [facility] [address] [place] [address] [place], [place]  [address] Phone - [phone]   Fax - [phone]  [facility] [address] [place], [place]  [address] Phone - [phone]   Fax - [phone]  DAVID RUBIN [address] [place], [place]  [address] Phone - [phone]   Fax - [phone]  [facility] [address] [place] [address] [place], [place]  [address] Phone - [phone]   Fax - [phone]  [facility] [address] [place], [place]  [address] Phone - [phone]   Fax - [phone] Gerarda Fraction [address] [place], [place]  [address] Phone - [phone]   Fax - [phone]  [facility] [address] [place], [place]  [address] Phone - [phone]   Fax - [phone]  [facility] - [place] [address] [place], [place]  [address] Phone - [phone]   Fax - [phone]     Breastfeeding  Choosing to breastfeed is one of the best decisions you can make for yourself and your baby. A change in hormones during pregnancy causes your breasts to make breast  milk in your milk-producing glands. Hormones prevent breast milk from being released before your baby is born. They also prompt milk flow after birth. Once breastfeeding has begun, thoughts of your baby, as well as his or her sucking or crying, can stimulate the release of milk from your milk-producing glands. Benefits of breastfeeding Research shows that breastfeeding offers many health benefits for infants and mothers. It also offers a  cost-free and convenient way to feed your baby. For your baby  Your first milk (colostrum) helps your baby's digestive system to function better.  Special cells in your milk (antibodies) help your baby to fight off infections.  Breastfed babies are less likely to develop asthma, allergies, obesity, or type 2 diabetes. They are also at lower risk for sudden infant death syndrome (SIDS).  Nutrients in breast milk are better able to meet your baby's needs compared to infant formula.  Breast milk improves your baby's brain development. For you  Breastfeeding helps to create a very special bond between you and your baby.  Breastfeeding is convenient. Breast milk costs nothing and is always available at the correct temperature.  Breastfeeding helps to burn calories. It helps you to lose the weight that you gained during pregnancy.  Breastfeeding makes your uterus return faster to its size before pregnancy. It also slows bleeding (lochia) after you give birth.  Breastfeeding helps to lower your risk of developing type 2 diabetes, osteoporosis, rheumatoid arthritis, cardiovascular disease, and breast, ovarian, uterine, and endometrial cancer later in life. Breastfeeding basics Starting breastfeeding  Find a comfortable place to sit or lie down, with your neck and back well-supported.  Place a pillow or a rolled-up blanket under your baby to bring him or her to the level of your breast (if you are seated). Nursing pillows are specially designed to help support your arms and your baby while you breastfeed.  Make sure that your baby's tummy (abdomen) is facing your abdomen.  Gently massage your breast. With your fingertips, massage from the outer edges of your breast inward toward the nipple. This encourages milk flow. If your milk flows slowly, you may need to continue this action during the feeding.  Support your breast with 4 fingers underneath and your thumb above your nipple (make the  letter "C" with your hand). Make sure your fingers are well away from your nipple and your baby's mouth.  Stroke your baby's lips gently with your finger or nipple.  When your baby's mouth is open wide enough, quickly bring your baby to your breast, placing your entire nipple and as much of the areola as possible into your baby's mouth. The areola is the colored area around your nipple. ? More areola should be visible above your baby's upper lip than below the lower lip. ? Your baby's lips should be opened and extended outward (flanged) to ensure an adequate, comfortable latch. ? Your baby's tongue should be between his or her lower gum and your breast.  Make sure that your baby's mouth is correctly positioned around your nipple (latched). Your baby's lips should create a seal on your breast and be turned out (everted).  It is common for your baby to suck about 2-3 minutes in order to start the flow of breast milk. Latching Teaching your baby how to latch onto your breast properly is very important. An improper latch can cause nipple pain, decreased milk supply, and poor weight gain in your baby. Also, if your baby is not latched onto your nipple properly,  he or she may swallow some air during feeding. This can make your baby fussy. Burping your baby when you switch breasts during the feeding can help to get rid of the air. However, teaching your baby to latch on properly is still the best way to prevent fussiness from swallowing air while breastfeeding. Signs that your baby has successfully latched onto your nipple  Silent tugging or silent sucking, without causing you pain. Infant's lips should be extended outward (flanged).  Swallowing heard between every 3-4 sucks once your milk has started to flow (after your let-down milk reflex occurs).  Muscle movement above and in front of his or her ears while sucking. Signs that your baby has not successfully latched onto your nipple  Sucking sounds  or smacking sounds from your baby while breastfeeding.  Nipple pain. If you think your baby has not latched on correctly, slip your finger into the corner of your baby's mouth to break the suction and place it between your baby's gums. Attempt to start breastfeeding again. Signs of successful breastfeeding Signs from your baby  Your baby will gradually decrease the number of sucks or will completely stop sucking.  Your baby will fall asleep.  Your baby's body will relax.  Your baby will retain a small amount of milk in his or her mouth.  Your baby will let go of your breast by himself or herself. Signs from you  Breasts that have increased in firmness, weight, and size 1-3 hours after feeding.  Breasts that are softer immediately after breastfeeding.  Increased milk volume, as well as a change in milk consistency and color by the fifth day of breastfeeding.  Nipples that are not sore, cracked, or bleeding. Signs that your baby is getting enough milk  Wetting at least 1-2 diapers during the first 24 hours after birth.  Wetting at least 5-6 diapers every 24 hours for the first week after birth. The urine should be clear or pale yellow by the age of 5 days.  Wetting 6-8 diapers every 24 hours as your baby continues to grow and develop.  At least 3 stools in a 24-hour period by the age of 5 days. The stool should be soft and yellow.  At least 3 stools in a 24-hour period by the age of 7 days. The stool should be seedy and yellow.  No loss of weight greater than 10% of birth weight during the first 3 days of life.  Average weight gain of 4-7 oz (113-198 g) per week after the age of 4 days.  Consistent daily weight gain by the age of 5 days, without weight loss after the age of 2 weeks. After a feeding, your baby may spit up a small amount of milk. This is normal. Breastfeeding frequency and duration Frequent feeding will help you make more milk and can prevent sore nipples and  extremely full breasts (breast engorgement). Breastfeed when you feel the need to reduce the fullness of your breasts or when your baby shows signs of hunger. This is called "breastfeeding on demand." Signs that your baby is hungry include:  Increased alertness, activity, or restlessness.  Movement of the head from side to side.  Opening of the mouth when the corner of the mouth or cheek is stroked (rooting).  Increased sucking sounds, smacking lips, cooing, sighing, or squeaking.  Hand-to-mouth movements and sucking on fingers or hands.  Fussing or crying. Avoid introducing a pacifier to your baby in the first 4-6 weeks after your  baby is born. After this time, you may choose to use a pacifier. Research has shown that pacifier use during the first year of a baby's life decreases the risk of sudden infant death syndrome (SIDS). Allow your baby to feed on each breast as long as he or she wants. When your baby unlatches or falls asleep while feeding from the first breast, offer the second breast. Because newborns are often sleepy in the first few weeks of life, you may need to awaken your baby to get him or her to feed. Breastfeeding times will vary from baby to baby. However, the following rules can serve as a guide to help you make sure that your baby is properly fed:  Newborns (babies 51 weeks of age or younger) may breastfeed every 1-3 hours.  Newborns should not go without breastfeeding for longer than 3 hours during the day or 5 hours during the night.  You should breastfeed your baby a minimum of 8 times in a 24-hour period. Breast milk pumping     Pumping and storing breast milk allows you to make sure that your baby is exclusively fed your breast milk, even at times when you are unable to breastfeed. This is especially important if you go back to work while you are still breastfeeding, or if you are not able to be present during feedings. Your lactation consultant can help you find a  method of pumping that works best for you and give you guidelines about how long it is safe to store breast milk. Caring for your breasts while you breastfeed Nipples can become dry, cracked, and sore while breastfeeding. The following recommendations can help keep your breasts moisturized and healthy:  Avoid using soap on your nipples.  Wear a supportive bra designed especially for nursing. Avoid wearing underwire-style bras or extremely tight bras (sports bras).  Air-dry your nipples for 3-4 minutes after each feeding.  Use only cotton bra pads to absorb leaked breast milk. Leaking of breast milk between feedings is normal.  Use lanolin on your nipples after breastfeeding. Lanolin helps to maintain your skin's normal moisture barrier. Pure lanolin is not harmful (not toxic) to your baby. You may also hand express a few drops of breast milk and gently massage that milk into your nipples and allow the milk to air-dry. In the first few weeks after giving birth, some women experience breast engorgement. Engorgement can make your breasts feel heavy, warm, and tender to the touch. Engorgement peaks within 3-5 days after you give birth. The following recommendations can help to ease engorgement:  Completely empty your breasts while breastfeeding or pumping. You may want to start by applying warm, moist heat (in the shower or with warm, water-soaked hand towels) just before feeding or pumping. This increases circulation and helps the milk flow. If your baby does not completely empty your breasts while breastfeeding, pump any extra milk after he or she is finished.  Apply ice packs to your breasts immediately after breastfeeding or pumping, unless this is too uncomfortable for you. To do this: ? Put ice in a plastic bag. ? Place a towel between your skin and the bag. ? Leave the ice on for 20 minutes, 2-3 times a day.  Make sure that your baby is latched on and positioned properly while  breastfeeding. If engorgement persists after 48 hours of following these recommendations, contact your health care provider or a Advertising copywriter. Overall health care recommendations while breastfeeding  Eat 3 healthy meals and  3 snacks every day. Well-nourished mothers who are breastfeeding need an additional 450-500 calories a day. You can meet this requirement by increasing the amount of a balanced diet that you eat.  Drink enough water to keep your urine pale yellow or clear.  Rest often, relax, and continue to take your prenatal vitamins to prevent fatigue, stress, and low vitamin and mineral levels in your body (nutrient deficiencies).  Do not use any products that contain nicotine or tobacco, such as cigarettes and e-cigarettes. Your baby may be harmed by chemicals from cigarettes that pass into breast milk and exposure to secondhand smoke. If you need help quitting, ask your health care provider.  Avoid alcohol.  Do not use illegal drugs or marijuana.  Talk with your health care provider before taking any medicines. These include over-the-counter and prescription medicines as well as vitamins and herbal supplements. Some medicines that may be harmful to your baby can pass through breast milk.  It is possible to become pregnant while breastfeeding. If birth control is desired, ask your health care provider about options that will be safe while breastfeeding your baby. Where to find more information: [employer]: www.llli.org Contact a health care provider if:  You feel like you want to stop breastfeeding or have become frustrated with breastfeeding.  Your nipples are cracked or bleeding.  Your breasts are red, tender, or warm.  You have: ? Painful breasts or nipples. ? A swollen area on either breast. ? A fever or chills. ? Nausea or vomiting. ? Drainage other than breast milk from your nipples.  Your breasts do not become full before feedings by the  fifth day after you give birth.  You feel sad and depressed.  Your baby is: ? Too sleepy to eat well. ? Having trouble sleeping. ? More than 521 week old and wetting fewer than 6 diapers in a 24-hour period. ? Not gaining weight by 805 days of age.  Your baby has fewer than 3 stools in a 24-hour period.  Your baby's skin or the white parts of his or her eyes become yellow. Get help right away if:  Your baby is overly tired (lethargic) and does not want to wake up and feed.  Your baby develops an unexplained fever. Summary  Breastfeeding offers many health benefits for infant and mothers.  Try to breastfeed your infant when he or she shows early signs of hunger.  Gently tickle or stroke your baby's lips with your finger or nipple to allow the baby to open his or her mouth. Bring the baby to your breast. Make sure that much of the areola is in your baby's mouth. Offer one side and burp the baby before you offer the other side.  Talk with your health care provider or lactation consultant if you have questions or you face problems as you breastfeed. This information is not intended to replace advice given to you by your health care provider. Make sure you discuss any questions you have with your health care provider. Document Revised: 06/13/2017 Document Reviewed: 04/20/2016 Elsevier Patient Education  2020 Elsevier Inc. Pregnancy and Influenza  Influenza, also called the flu, is an infection of the lungs and airways (respiratory tract). If you are pregnant, you are more likely to catch the flu. You are also more likely to have a more serious case of the flu. This is because pregnancy causes changes to your body's disease-fighting system (immune system), heart, and lungs. If you develop a bad case  of the flu, especially with a high fever, this can cause problems for you and your developing baby. How do people get the flu? The flu is caused by a type of germ called a virus. It spreads when  virus particles get passed from person to person by:  Being near a sick person who is coughing or sneezing.  Touching something that has the virus on it and then touching your mouth, nose, or face. The influenza virus is most common during the fall and winter. How can I protect myself against the flu?  Get a flu shot. The best way to prevent the flu is to get a flu shot before flu season starts. The flu shot is not dangerous for your developing baby. It may even help protect your baby from the flu for up to 6 months after birth.  Wash your hands often with soap and warm water. If soap and water are not available, use hand sanitizer.  Do not come in close contact with sick people.  Do not share food, drinks, or utensils with other people.  Avoid touching your eyes, nose, and mouth.  Clean frequently used surfaces at home, school, or work.  Practice healthy lifestyle habits, such as: ? Eating a healthy, balanced diet. ? Drinking plenty of fluids. ? Exercising regularly or as told by your health care provider. ? Sleeping 7-9 hours each night. ? Finding ways to manage stress. What should I do if I have flu symptoms?  If you have any symptoms of the flu, even after getting a flu shot, contact your health care provider right away.  To reduce fever, take over-the-counter acetaminophen as told by your health care provider.  If you have the flu, you may get antiviral medicine to keep the flu from becoming severe and to shorten how long it lasts.  Avoid spreading the flu to others: ? Stay home until you are well. ? Cover your nose and mouth when you cough or sneeze. ? Wash your hands often. Follow these instructions at home:  Take over-the-counter and prescription medicines only as told by your health care provider. Do not take any medicine, including cold or flu medicine, unless your health care provider tells you to do so.  If you were prescribed antiviral medicine, take it as told  by your health care provider. Do not stop taking the antiviral medicine even if you start to feel better.  Eat a nutrient-rich diet that includes fresh fruits and vegetables, whole grains, lean protein, and low-fat dairy.  Drink enough fluid to keep your urine clear or pale yellow.  Get plenty of rest. Contact a health care provider if:  You have fever or chills.  You have a cough, sore throat, or stuffy nose.  You have worsening or unusual: ? Muscle aches. ? Headache. ? Tiredness. ? Loss of appetite.  You have vomiting or diarrhea. Get help right away if:  You have trouble breathing.  You have chest pain.  You have abdominal pain.  You begin to have labor pains.  You have a fever that does not go down 24 hours after you take medicine.  You do not feel your baby move.  You have diarrhea or vomiting that will not go away.  You have dizziness or confusion.  Your symptoms do not improve, even with treatment. Summary  If you are pregnant, you are more likely to catch the flu. You are also more likely to have a more serious case of the flu.  If you have flu-like symptoms, call your health care provider right away. If you develop a bad case of the flu, especially with a high fever, this can be dangerous for your developing baby.  The best way to prevent the flu is to get a flu shot before flu season starts. The flu shot is not dangerous for your developing baby.  If you have the flu and were prescribed antiviral medicine, take it as told by your health care provider. This information is not intended to replace advice given to you by your health care provider. Make sure you discuss any questions you have with your health care provider. Document Revised: 07/11/2018 Document Reviewed: 05/15/2016 Elsevier Patient Education  2020 [employer].   Contraception Choices - www.bedsider.org Contraception, also called birth control, refers to methods or devices that prevent  pregnancy. Hormonal methods Contraceptive implant  A contraceptive implant is a thin, plastic tube that contains a hormone. It is inserted into the upper part of the arm. It can remain in place for up to 3 years. Progestin-only injections Progestin-only injections are injections of progestin, a synthetic form of the hormone progesterone. They are given every 3 months by a health care provider. Birth control pills  Birth control pills are pills that contain hormones that prevent pregnancy. They must be taken once a day, preferably at the same time each day. Birth control patch  The birth control patch contains hormones that prevent pregnancy. It is placed on the skin and must be changed once a week for three weeks and removed on the fourth week. A prescription is needed to use this method of contraception. Vaginal ring  A vaginal ring contains hormones that prevent pregnancy. It is placed in the vagina for three weeks and removed on the fourth week. After that, the process is repeated with a new ring. A prescription is needed to use this method of contraception. Emergency contraceptive Emergency contraceptives prevent pregnancy after unprotected sex. They come in pill form and can be taken up to 5 days after sex. They work best the sooner they are taken after having sex. Most emergency contraceptives are available without a prescription. This method should not be used as your only form of birth control. Barrier methods Female condom  A female condom is a thin sheath that is worn over the penis during sex. Condoms keep sperm from going inside a woman's body. They can be used with a spermicide to increase their effectiveness. They should be disposed after a single use. Female condom  A female condom is a soft, loose-fitting sheath that is put into the vagina before sex. The condom keeps sperm from going inside a woman's body. They should be disposed after a single use. Diaphragm  A diaphragm is a  soft, dome-shaped barrier. It is inserted into the vagina before sex, along with a spermicide. The diaphragm blocks sperm from entering the uterus, and the spermicide kills sperm. A diaphragm should be left in the vagina for 6-8 hours after sex and removed within 24 hours. A diaphragm is prescribed and fitted by a health care provider. A diaphragm should be replaced every 1-2 years, after giving birth, after gaining more than 15 lb (6.8 kg), and after pelvic surgery. Cervical cap  A cervical cap is a round, soft latex or plastic cup that fits over the cervix. It is inserted into the vagina before sex, along with spermicide. It blocks sperm from entering the uterus. The cap should be left in place for  6-8 hours after sex and removed within 48 hours. A cervical cap must be prescribed and fitted by a health care provider. It should be replaced every 2 years. Sponge  A sponge is a soft, circular piece of polyurethane foam with spermicide on it. The sponge helps block sperm from entering the uterus, and the spermicide kills sperm. To use it, you make it wet and then insert it into the vagina. It should be inserted before sex, left in for at least 6 hours after sex, and removed and thrown away within 30 hours. Spermicides Spermicides are chemicals that kill or block sperm from entering the cervix and uterus. They can come as a cream, jelly, suppository, foam, or tablet. A spermicide should be inserted into the vagina with an applicator at least 10-15 minutes before sex to allow time for it to work. The process must be repeated every time you have sex. Spermicides do not require a prescription. Intrauterine contraception Intrauterine device (IUD) An IUD is a T-shaped device that is put in a woman's uterus. There are two types:  Hormone IUD.This type contains progestin, a synthetic form of the hormone progesterone. This type can stay in place for 3-5 years.  Copper IUD.This type is wrapped in copper wire. It  can stay in place for 10 years.  Permanent methods of contraception Female tubal ligation In this method, a woman's fallopian tubes are sealed, tied, or blocked during surgery to prevent eggs from traveling to the uterus. Hysteroscopic sterilization In this method, a small, flexible insert is placed into each fallopian tube. The inserts cause scar tissue to form in the fallopian tubes and block them, so sperm cannot reach an egg. The procedure takes about 3 months to be effective. Another form of birth control must be used during those 3 months. Female sterilization This is a procedure to tie off the tubes that carry sperm (vasectomy). After the procedure, the man can still ejaculate fluid (semen). Natural planning methods Natural family planning In this method, a couple does not have sex on days when the woman could become pregnant. Calendar method This means keeping track of the length of each menstrual cycle, identifying the days when pregnancy can happen, and not having sex on those days. Ovulation method In this method, a couple avoids sex during ovulation. Symptothermal method This method involves not having sex during ovulation. The woman typically checks for ovulation by watching changes in her temperature and in the consistency of cervical mucus. Post-ovulation method In this method, a couple waits to have sex until after ovulation. Summary  Contraception, also called birth control, means methods or devices that prevent pregnancy.  Hormonal methods of contraception include implants, injections, pills, patches, vaginal rings, and emergency contraceptives.  Barrier methods of contraception can include female condoms, female condoms, diaphragms, cervical caps, sponges, and spermicides.  There are two types of IUDs (intrauterine devices). An IUD can be put in a woman's uterus to prevent pregnancy for 3-5 years.  Permanent sterilization can be done through a procedure for males,  females, or both.  Natural family planning methods involve not having sex on days when the woman could become pregnant. This information is not intended to replace advice given to you by your health care provider. Make sure you discuss any questions you have with your health care provider. Document Revised: 03/21/2017 Document Reviewed: 04/21/2016 Elsevier Patient Education  2020 [employer].  Preterm Labor and Birth Information  The normal length of a pregnancy is 39-41 weeks. Preterm  labor is when labor starts before 37 completed weeks of pregnancy. What are the risk factors for preterm labor? Preterm labor is more likely to occur in women who:  Have certain infections during pregnancy such as a bladder infection, sexually transmitted infection, or infection inside the uterus (chorioamnionitis).  Have a shorter-than-normal cervix.  Have gone into preterm labor before.  Have had surgery on their cervix.  Are younger than age 107 or older than age 8.  Are African American.  Are pregnant with twins or multiple babies (multiple gestation).  Take street drugs or smoke while pregnant.  Do not gain enough weight while pregnant.  Became pregnant shortly after having been pregnant. What are the symptoms of preterm labor? Symptoms of preterm labor include:  Cramps similar to those that can happen during a menstrual period. The cramps may happen with diarrhea.  Pain in the abdomen or lower back.  Regular uterine contractions that may feel like tightening of the abdomen.  A feeling of increased pressure in the pelvis.  Increased watery or bloody mucus discharge from the vagina.  Water breaking (ruptured amniotic sac). Why is it important to recognize signs of preterm labor? It is important to recognize signs of preterm labor because babies who are born prematurely may not be fully developed. This can put them at an increased risk for:  Long-term (chronic) heart and lung  problems.  Difficulty immediately after birth with regulating body systems, including blood sugar, body temperature, heart rate, and breathing rate.  Bleeding in the brain.  Cerebral palsy.  Learning difficulties.  Death. These risks are highest for babies who are born before 23 weeks of pregnancy. How is preterm labor treated? Treatment depends on the length of your pregnancy, your condition, and the health of your baby. It may involve:  Having a stitch (suture) placed in your cervix to prevent your cervix from opening too early (cerclage).  Taking or being given medicines, such as: ? Hormone medicines. These may be given early in pregnancy to help support the pregnancy. ? Medicine to stop contractions. ? Medicines to help mature the baby's lungs. These may be prescribed if the risk of delivery is high. ? Medicines to prevent your baby from developing cerebral palsy. If the labor happens before 34 weeks of pregnancy, you may need to stay in the hospital. What should I do if I think I am in preterm labor? If you think that you are going into preterm labor, call your health care provider right away. How can I prevent preterm labor in future pregnancies? To increase your chance of having a full-term pregnancy:  Do not use any tobacco products, such as cigarettes, chewing tobacco, and e-cigarettes. If you need help quitting, ask your health care provider.  Do not use street drugs or medicines that have not been prescribed to you during your pregnancy.  Talk with your health care provider before taking any herbal supplements, even if you have been taking them regularly.  Make sure you gain a healthy amount of weight during your pregnancy.  Watch for infection. If you think that you might have an infection, get it checked right away.  Make sure to tell your health care provider if you have gone into preterm labor before. This information is not intended to replace advice given to  you by your health care provider. Make sure you discuss any questions you have with your health care provider. Document Revised: 07/11/2018 Document Reviewed: 08/10/2015 Elsevier Patient Education  2020 Elsevier  Inc.  Maternity Assessment Unit (MAU)  The Maternity Assessment Unit (MAU) is located at the [facility]. The address is: [address], [address], [place], [place] [id number]. Please see map below for additional directions.    The Maternity Assessment Unit is designed to help you during your pregnancy, and for up to 6 weeks after delivery, with any pregnancy- or postpartum-related emergencies, if you think you are in labor, or if your water has broken. For example, if you experience nausea and vomiting, vaginal bleeding, severe abdominal or pelvic pain, elevated blood pressure or other problems related to your pregnancy or postpartum time, please come to the Maternity Assessment Unit for assistance.

## 2019-06-05 NOTE — Progress Notes (Signed)
Subjective:  Donna Huang is a 19 y.o. G1P0 at [redacted]w[redacted]d being seen today for ongoing prenatal care.  She is currently monitored for the following issues for this low-risk pregnancy and has Patellar dislocation; Attention deficit disorder (ADD) without hyperactivity; Dysmenorrhea; and Supervision of normal first pregnancy on their problem list.  Patient reports no complaints.  Contractions: Not present. Vag. Bleeding: None.  Movement: Present. Denies leaking of fluid.   The following portions of the patient's history were reviewed and updated as appropriate: allergies, current medications, past family history, past medical history, past social history, past surgical history and problem list. Problem list updated.  Objective:   Vitals:   06/05/19 0827  BP: 107/67  Temp: 98.3 F (36.8 C)  Weight: 160 lb (72.6 kg)    Fetal Status: Fetal Heart Rate (bpm): 143 Fundal Height: 27 cm Movement: Present     General:  Alert, oriented and cooperative. Patient is in no acute distress.  Skin: Skin is warm and dry. No rash noted.   Cardiovascular: Normal heart rate noted  Respiratory: Normal respiratory effort, no problems with respiration noted  Abdomen: Soft, gravid, appropriate for gestational age. Pain/Pressure: Absent     Pelvic: Vag. Bleeding: None Vag D/C Character: Thin   Cervical exam deferred        Extremities: Normal range of motion.  Edema: None  Mental Status: Normal mood and affect. Normal behavior. Normal judgment and thought content.    Assessment and Plan:  Pregnancy: G1P0 at [redacted]w[redacted]d  1. Encounter for supervision of normal first pregnancy in second trimester - discussed appropriate nutrition in pregnancy including eating q2-3hrs and adequate hydration - flu vaccine offered, pt reports she got the flu vaccine in 01/2019 - pt plans to breastfeed and bottle feed - discussed contraception, pt unsure, information given - peds list given - Antibody screen; Future - 2Hr GTT w/ 1 Hr  C[place]enter 75 g - CBC - HIV antibody (with reflex) - RPR - rho (d) immune globulin (RHIG/RHOPHYLAC) injection 300 mcg - Tdap vaccine greater than or equal to 7yo IM   Preterm labor symptoms and general obstetric precautions including but not limited to vaginal bleeding, contractions, leaking of fluid and fetal movement were reviewed in detail with the patient. I discussed the assessment and treatment plan with the patient. The patient was provided an opportunity to ask questions and all were answered. The patient agreed with the plan and demonstrated an understanding of the instructions. The patient was advised to call back or seek an in-person office evaluation/go to MAU at [facility] for any urgent or concerning symptoms. Please refer to After Visit Summary for other counseling recommendations.  Return in about 2 weeks (around 06/19/2019) for virtual ROB.   Kayslee Furey, Odie Sera, NP

## 2019-06-08 LAB — 2HR GTT W 1 HR, CARPENTER, 75 G
Glucose, 1 Hr, Gest: 107 mg/dL (ref 65–179)
Glucose, 2 Hr, Gest: 110 mg/dL (ref 65–152)
Glucose, Fasting, Gest: 73 mg/dL (ref 65–91)

## 2019-06-08 LAB — CBC
HCT: 34.9 % — ABNORMAL LOW (ref 35.0–45.0)
Hemoglobin: 12.2 g/dL (ref 11.7–15.5)
MCH: 34 pg — ABNORMAL HIGH (ref 27.0–33.0)
MCHC: 35 g/dL (ref 32.0–36.0)
MCV: 97.2 fL (ref 80.0–100.0)
MPV: 11.3 fL (ref 7.5–12.5)
Platelets: 186 10*3/uL (ref 140–400)
RBC: 3.59 10*6/uL — ABNORMAL LOW (ref 3.80–5.10)
RDW: 11.9 % (ref 11.0–15.0)
WBC: 11 10*3/uL — ABNORMAL HIGH (ref 3.8–10.8)

## 2019-06-08 LAB — RPR: RPR Ser Ql: NONREACTIVE

## 2019-06-08 LAB — ANTIBODY SCREEN: Antibody Screen: NOT DETECTED

## 2019-06-08 LAB — HIV ANTIBODY (ROUTINE TESTING W REFLEX): HIV 1&2 Ab, 4th Generation: NONREACTIVE

## 2019-06-10 ENCOUNTER — Encounter (INDEPENDENT_AMBULATORY_CARE_PROVIDER_SITE_OTHER): Payer: Self-pay | Admitting: *Deleted

## 2019-06-10 ENCOUNTER — Encounter: Payer: Self-pay | Admitting: *Deleted

## 2019-06-18 ENCOUNTER — Telehealth (INDEPENDENT_AMBULATORY_CARE_PROVIDER_SITE_OTHER): Payer: Managed Care, Other (non HMO) | Admitting: Obstetrics & Gynecology

## 2019-06-18 VITALS — BP 114/69 | HR 116

## 2019-06-18 DIAGNOSIS — Z3403 Encounter for supervision of normal first pregnancy, third trimester: Secondary | ICD-10-CM

## 2019-06-18 DIAGNOSIS — Z283 Underimmunization status: Secondary | ICD-10-CM | POA: Insufficient documentation

## 2019-06-18 DIAGNOSIS — Z2839 Other underimmunization status: Secondary | ICD-10-CM | POA: Insufficient documentation

## 2019-06-18 DIAGNOSIS — R42 Dizziness and giddiness: Secondary | ICD-10-CM

## 2019-06-18 DIAGNOSIS — R55 Syncope and collapse: Secondary | ICD-10-CM

## 2019-06-18 NOTE — Progress Notes (Signed)
   OBSTETRICS PRENATAL VIRTUAL VISIT ENCOUNTER NOTE  Provider location: [facility] [place]   I connected with [name] on 06/18/19 at  1:45 PM EDT by MyChart Video Encounter at home and verified that I am speaking with the correct person using two identifiers.   I discussed the limitations, risks, security and privacy concerns of performing an evaluation and management service virtually and the availability of in person appointments. I also discussed with the patient that there may be a patient responsible charge related to this service. The patient expressed understanding and agreed to proceed. Subjective:  Donna Huang is a 19 y.o. G1P0 at [redacted]w[redacted]d being seen today for ongoing prenatal care.  She is currently monitored for the following issues for this low-risk pregnancy and has Patellar dislocation; Attention deficit disorder (ADD) without hyperactivity; Dysmenorrhea; Supervision of normal first pregnancy; and Rubella non-immune status, antepartum on their problem list.  Patient reports presyncopal episodes that occur at least once a week. Happens when she stands up too quickly or at work.  No associated heart palpitations or SOB or other symptoms. Never actually passes out, just feels she is about to do so and she sits down.  Contractions: Not present. Vag. Bleeding: None.  Movement: Present. Denies any leaking of fluid.   The following portions of the patient's history were reviewed and updated as appropriate: allergies, current medications, past family history, past medical history, past social history, past surgical history and problem list.   Objective:   Vitals:   06/18/19 1028  BP: 114/69  Pulse: (!) 116    Fetal Status:     Movement: Present     General:  Alert, oriented and cooperative. Patient is in no acute distress.  Respiratory: Normal respiratory effort, no problems with respiration noted  Mental Status: Normal mood and affect. Normal behavior.  Normal judgment and thought content.  Rest of physical exam deferred due to type of encounter  Imaging: No results found.  Assessment and Plan:  Pregnancy: G1P0 at [redacted]w[redacted]d 1. Postural dizziness with presyncope This can happen during pregnancy.   Advised to keep hydrated and cool, and to monitor her symptoms. If episodes worsen, will refer to Cardiology or do other evaluation.  2. Encounter for supervision of normal first pregnancy in third trimester Reviewed normal third trimester labs, all questions answered. Discussed third trimester phenomena in detail. Preterm labor symptoms and general obstetric precautions including but not limited to vaginal bleeding, contractions, leaking of fluid and fetal movement were reviewed in detail with the patient. I discussed the assessment and treatment plan with the patient. The patient was provided an opportunity to ask questions and all were answered. The patient agreed with the plan and demonstrated an understanding of the instructions. The patient was advised to call back or seek an in-person office evaluation/go to MAU at [facility] for any urgent or concerning symptoms. Please refer to After Visit Summary for other counseling recommendations.   I provided 10 minutes of face-to-face time during this encounter.  Return in about 2 weeks (around 07/02/2019) for OFFICE OB Visit.  No future appointments.  Jaynie Collins, MD Center for [employer], [facility]

## 2019-06-18 NOTE — Patient Instructions (Signed)
Return to office for any scheduled appointments. Call the office or go to the [facility] at [facility] [place] [facility] if:  You begin to have strong, frequent contractions  Your water breaks.  Sometimes it is a big gush of fluid, sometimes it is just a trickle that keeps getting your panties wet or running down your legs  You have vaginal bleeding.  It is normal to have a small amount of spotting if your cervix was checked.   You do not feel your baby moving like normal.  If you do not, get something to eat and drink and lay down and focus on feeling your baby move.   If your baby is still not moving like normal, you should call the office or go to MAU.  Any other obstetric concerns.   Third Trimester of Pregnancy The third trimester is from week 28 through week 40 (months 7 through 9). The third trimester is a time when the unborn baby (fetus) is growing rapidly. At the end of the ninth month, the fetus is about 20 inches in length and weighs 6-10 pounds. Body changes during your third trimester Your body will continue to go through many changes during pregnancy. The changes vary from woman to woman. During the third trimester:  Your weight will continue to increase. You can expect to gain 25-35 pounds (11-16 kg) by the end of the pregnancy.  You may begin to get stretch marks on your hips, abdomen, and breasts.  You may urinate more often because the fetus is moving lower into your pelvis and pressing on your bladder.  You may develop or continue to have heartburn. This is caused by increased hormones that slow down muscles in the digestive tract.  You may develop or continue to have constipation because increased hormones slow digestion and cause the muscles that push waste through your intestines to relax.  You may develop hemorrhoids. These are swollen veins (varicose veins) in the rectum that can itch or be painful.  You may develop swollen, bulging veins (varicose veins)  in your legs.  You may have increased body aches in the pelvis, back, or thighs. This is due to weight gain and increased hormones that are relaxing your joints.  You may have changes in your hair. These can include thickening of your hair, rapid growth, and changes in texture. Some women also have hair loss during or after pregnancy, or hair that feels dry or thin. Your hair will most likely return to normal after your baby is born.  Your breasts will continue to grow and they will continue to become tender. A yellow fluid (colostrum) may leak from your breasts. This is the first milk you are producing for your baby.  Your belly button may stick out.  You may notice more swelling in your hands, face, or ankles.  You may have increased tingling or numbness in your hands, arms, and legs. The skin on your belly may also feel numb.  You may feel short of breath because of your expanding uterus.  You may have more problems sleeping. This can be caused by the size of your belly, increased need to urinate, and an increase in your body's metabolism.  You may notice the fetus "dropping," or moving lower in your abdomen (lightening).  You may have increased vaginal discharge.  You may notice your joints feel loose and you may have pain around your pelvic bone. What to expect at prenatal visits You will have   prenatal exams every 2 weeks until week 36. Then you will have weekly prenatal exams. During a routine prenatal visit:  You will be weighed to make sure you and the baby are growing normally.  Your blood pressure will be taken.  Your abdomen will be measured to track your baby's growth.  The fetal heartbeat will be listened to.  Any test results from the previous visit will be discussed.  You may have a cervical check near your due date to see if your cervix has softened or thinned (effaced).  You will be tested for Group B streptococcus. This happens between 35 and 37 weeks. Your  health care provider may ask you:  What your birth plan is.  How you are feeling.  If you are feeling the baby move.  If you have had any abnormal symptoms, such as leaking fluid, bleeding, severe headaches, or abdominal cramping.  If you are using any tobacco products, including cigarettes, chewing tobacco, and electronic cigarettes.  If you have any questions. Other tests or screenings that may be performed during your third trimester include:  Blood tests that check for low iron levels (anemia).  Fetal testing to check the health, activity level, and growth of the fetus. Testing is done if you have certain medical conditions or if there are problems during the pregnancy.  Nonstress test (NST). This test checks the health of your baby to make sure there are no signs of problems, such as the baby not getting enough oxygen. During this test, a belt is placed around your belly. The baby is made to move, and its heart rate is monitored during movement. What is false labor? False labor is a condition in which you feel small, irregular tightenings of the muscles in the womb (contractions) that usually go away with rest, changing position, or drinking water. These are called Braxton Hicks contractions. Contractions may last for hours, days, or even weeks before true labor sets in. If contractions come at regular intervals, become more frequent, increase in intensity, or become painful, you should see your health care provider. What are the signs of labor?  Abdominal cramps.  Regular contractions that start at 10 minutes apart and become stronger and more frequent with time.  Contractions that start on the top of the uterus and spread down to the lower abdomen and back.  Increased pelvic pressure and dull back pain.  A watery or bloody mucus discharge that comes from the vagina.  Leaking of amniotic fluid. This is also known as your "water breaking." It could be a slow trickle or a gush.  Let your health care provider know if it has a color or strange odor. If you have any of these signs, call your health care provider right away, even if it is before your due date. Follow these instructions at home: Medicines  Follow your health care provider's instructions regarding medicine use. Specific medicines may be either safe or unsafe to take during pregnancy.  Take a prenatal vitamin that contains at least 600 micrograms (mcg) of folic acid.  If you develop constipation, try taking a stool softener if your health care provider approves. Eating and drinking   Eat a balanced diet that includes fresh fruits and vegetables, whole grains, good sources of protein such as meat, eggs, or tofu, and low-fat dairy. Your health care provider will help you determine the amount of weight gain that is right for you.  Avoid raw meat and uncooked cheese. These carry germs that   can cause birth defects in the baby.  If you have low calcium intake from food, talk to your health care provider about whether you should take a daily calcium supplement.  Eat four or five small meals rather than three large meals a day.  Limit foods that are high in fat and processed sugars, such as fried and sweet foods.  To prevent constipation: ? Drink enough fluid to keep your urine clear or pale yellow. ? Eat foods that are high in fiber, such as fresh fruits and vegetables, whole grains, and beans. Activity  Exercise only as directed by your health care provider. Most women can continue their usual exercise routine during pregnancy. Try to exercise for 30 minutes at least 5 days a week. Stop exercising if you experience uterine contractions.  Avoid heavy lifting.  Do not exercise in extreme heat or humidity, or at high altitudes.  Wear low-heel, comfortable shoes.  Practice good posture.  You may continue to have sex unless your health care provider tells you otherwise. Relieving pain and  discomfort  Take frequent breaks and rest with your legs elevated if you have leg cramps or low back pain.  Take warm sitz baths to soothe any pain or discomfort caused by hemorrhoids. Use hemorrhoid cream if your health care provider approves.  Wear a good support bra to prevent discomfort from breast tenderness.  If you develop varicose veins: ? Wear support pantyhose or compression stockings as told by your healthcare provider. ? Elevate your feet for 15 minutes, 3-4 times a day. Prenatal care  Write down your questions. Take them to your prenatal visits.  Keep all your prenatal visits as told by your health care provider. This is important. Safety  Wear your seat belt at all times when driving.  Make a list of emergency phone numbers, including numbers for family, friends, the hospital, and police and fire departments. General instructions  Avoid cat litter boxes and soil used by cats. These carry germs that can cause birth defects in the baby. If you have a cat, ask someone to clean the litter box for you.  Do not travel far distances unless it is absolutely necessary and only with the approval of your health care provider.  Do not use hot tubs, steam rooms, or saunas.  Do not drink alcohol.  Do not use any products that contain nicotine or tobacco, such as cigarettes and e-cigarettes. If you need help quitting, ask your health care provider.  Do not use any medicinal herbs or unprescribed drugs. These chemicals affect the formation and growth of the baby.  Do not douche or use tampons or scented sanitary pads.  Do not cross your legs for long periods of time.  To prepare for the arrival of your baby: ? Take prenatal classes to understand, practice, and ask questions about labor and delivery. ? Make a trial run to the hospital. ? Visit the hospital and tour the maternity area. ? Arrange for maternity or paternity leave through employers. ? Arrange for family and  friends to take care of pets while you are in the hospital. ? Purchase a rear-facing car seat and make sure you know how to install it in your car. ? Pack your hospital bag. ? Prepare the baby's nursery. Make sure to remove all pillows and stuffed animals from the baby's crib to prevent suffocation.  Visit your dentist if you have not gone during your pregnancy. Use a soft toothbrush to brush your teeth and be   gentle when you floss. Contact a health care provider if:  You are unsure if you are in labor or if your water has broken.  You become dizzy.  You have mild pelvic cramps, pelvic pressure, or nagging pain in your abdominal area.  You have lower back pain.  You have persistent nausea, vomiting, or diarrhea.  You have an unusual or bad smelling vaginal discharge.  You have pain when you urinate. Get help right away if:  Your water breaks before 37 weeks.  You have regular contractions less than 5 minutes apart before 37 weeks.  You have a fever.  You are leaking fluid from your vagina.  You have spotting or bleeding from your vagina.  You have severe abdominal pain or cramping.  You have rapid weight loss or weight gain.  You have shortness of breath with chest pain.  You notice sudden or extreme swelling of your face, hands, ankles, feet, or legs.  Your baby makes fewer than 10 movements in 2 hours.  You have severe headaches that do not go away when you take medicine.  You have vision changes. Summary  The third trimester is from week 28 through week 40, months 7 through 9. The third trimester is a time when the unborn baby (fetus) is growing rapidly.  During the third trimester, your discomfort may increase as you and your baby continue to gain weight. You may have abdominal, leg, and back pain, sleeping problems, and an increased need to urinate.  During the third trimester your breasts will keep growing and they will continue to become tender. A yellow  fluid (colostrum) may leak from your breasts. This is the first milk you are producing for your baby.  False labor is a condition in which you feel small, irregular tightenings of the muscles in the womb (contractions) that eventually go away. These are called Braxton Hicks contractions. Contractions may last for hours, days, or even weeks before true labor sets in.  Signs of labor can include: abdominal cramps; regular contractions that start at 10 minutes apart and become stronger and more frequent with time; watery or bloody mucus discharge that comes from the vagina; increased pelvic pressure and dull back pain; and leaking of amniotic fluid. This information is not intended to replace advice given to you by your health care provider. Make sure you discuss any questions you have with your health care provider. Document Revised: 07/10/2018 Document Reviewed: 04/24/2016 [employer] Patient Education  2020 [employer]  

## 2019-07-07 ENCOUNTER — Ambulatory Visit (INDEPENDENT_AMBULATORY_CARE_PROVIDER_SITE_OTHER): Payer: Managed Care, Other (non HMO) | Admitting: Advanced Practice Midwife

## 2019-07-07 ENCOUNTER — Other Ambulatory Visit: Payer: Self-pay

## 2019-07-07 VITALS — BP 111/69 | HR 98 | Wt 175.0 lb

## 2019-07-07 DIAGNOSIS — R42 Dizziness and giddiness: Secondary | ICD-10-CM

## 2019-07-07 DIAGNOSIS — Z3403 Encounter for supervision of normal first pregnancy, third trimester: Secondary | ICD-10-CM

## 2019-07-07 DIAGNOSIS — R55 Syncope and collapse: Secondary | ICD-10-CM

## 2019-07-07 NOTE — Patient Instructions (Signed)
Third Trimester of Pregnancy The third trimester is from week 28 through week 40 (months 7 through 9). The third trimester is a time when the unborn baby (fetus) is growing rapidly. At the end of the ninth month, the fetus is about 20 inches in length and weighs 6-10 pounds. Body changes during your third trimester Your body will continue to go through many changes during pregnancy. The changes vary from woman to woman. During the third trimester:  Your weight will continue to increase. You can expect to gain 25-35 pounds (11-16 kg) by the end of the pregnancy.  You may begin to get stretch marks on your hips, abdomen, and breasts.  You may urinate more often because the fetus is moving lower into your pelvis and pressing on your bladder.  You may develop or continue to have heartburn. This is caused by increased hormones that slow down muscles in the digestive tract.  You may develop or continue to have constipation because increased hormones slow digestion and cause the muscles that push waste through your intestines to relax.  You may develop hemorrhoids. These are swollen veins (varicose veins) in the rectum that can itch or be painful.  You may develop swollen, bulging veins (varicose veins) in your legs.  You may have increased body aches in the pelvis, back, or thighs. This is due to weight gain and increased hormones that are relaxing your joints.  You may have changes in your hair. These can include thickening of your hair, rapid growth, and changes in texture. Some women also have hair loss during or after pregnancy, or hair that feels dry or thin. Your hair will most likely return to normal after your baby is born.  Your breasts will continue to grow and they will continue to become tender. A yellow fluid (colostrum) may leak from your breasts. This is the first milk you are producing for your baby.  Your belly button may stick out.  You may notice more swelling in your hands,  face, or ankles.  You may have increased tingling or numbness in your hands, arms, and legs. The skin on your belly may also feel numb.  You may feel short of breath because of your expanding uterus.  You may have more problems sleeping. This can be caused by the size of your belly, increased need to urinate, and an increase in your body's metabolism.  You may notice the fetus "dropping," or moving lower in your abdomen (lightening).  You may have increased vaginal discharge.  You may notice your joints feel loose and you may have pain around your pelvic bone. What to expect at prenatal visits You will have prenatal exams every 2 weeks until week 36. Then you will have weekly prenatal exams. During a routine prenatal visit:  You will be weighed to make sure you and the baby are growing normally.  Your blood pressure will be taken.  Your abdomen will be measured to track your baby's growth.  The fetal heartbeat will be listened to.  Any test results from the previous visit will be discussed.  You may have a cervical check near your due date to see if your cervix has softened or thinned (effaced).  You will be tested for Group B streptococcus. This happens between 35 and 37 weeks. Your health care provider may ask you:  What your birth plan is.  How you are feeling.  If you are feeling the baby move.  If you have had any abnormal   symptoms, such as leaking fluid, bleeding, severe headaches, or abdominal cramping.  If you are using any tobacco products, including cigarettes, chewing tobacco, and electronic cigarettes.  If you have any questions. Other tests or screenings that may be performed during your third trimester include:  Blood tests that check for low iron levels (anemia).  Fetal testing to check the health, activity level, and growth of the fetus. Testing is done if you have certain medical conditions or if there are problems during the pregnancy.  Nonstress test  (NST). This test checks the health of your baby to make sure there are no signs of problems, such as the baby not getting enough oxygen. During this test, a belt is placed around your belly. The baby is made to move, and its heart rate is monitored during movement. What is false labor? False labor is a condition in which you feel small, irregular tightenings of the muscles in the womb (contractions) that usually go away with rest, changing position, or drinking water. These are called Braxton Hicks contractions. Contractions may last for hours, days, or even weeks before true labor sets in. If contractions come at regular intervals, become more frequent, increase in intensity, or become painful, you should see your health care provider. What are the signs of labor?  Abdominal cramps.  Regular contractions that start at 10 minutes apart and become stronger and more frequent with time.  Contractions that start on the top of the uterus and spread down to the lower abdomen and back.  Increased pelvic pressure and dull back pain.  A watery or bloody mucus discharge that comes from the vagina.  Leaking of amniotic fluid. This is also known as your "water breaking." It could be a slow trickle or a gush. Let your health care provider know if it has a color or strange odor. If you have any of these signs, call your health care provider right away, even if it is before your due date. Follow these instructions at home: Medicines  Follow your health care provider's instructions regarding medicine use. Specific medicines may be either safe or unsafe to take during pregnancy.  Take a prenatal vitamin that contains at least 600 micrograms (mcg) of folic acid.  If you develop constipation, try taking a stool softener if your health care provider approves. Eating and drinking   Eat a balanced diet that includes fresh fruits and vegetables, whole grains, good sources of protein such as meat, eggs, or tofu,  and low-fat dairy. Your health care provider will help you determine the amount of weight gain that is right for you.  Avoid raw meat and uncooked cheese. These carry germs that can cause birth defects in the baby.  If you have low calcium intake from food, talk to your health care provider about whether you should take a daily calcium supplement.  Eat four or five small meals rather than three large meals a day.  Limit foods that are high in fat and processed sugars, such as fried and sweet foods.  To prevent constipation: ? Drink enough fluid to keep your urine clear or pale yellow. ? Eat foods that are high in fiber, such as fresh fruits and vegetables, whole grains, and beans. Activity  Exercise only as directed by your health care provider. Most women can continue their usual exercise routine during pregnancy. Try to exercise for 30 minutes at least 5 days a week. Stop exercising if you experience uterine contractions.  Avoid heavy lifting.  Do   not exercise in extreme heat or humidity, or at high altitudes.  Wear low-heel, comfortable shoes.  Practice good posture.  You may continue to have sex unless your health care provider tells you otherwise. Relieving pain and discomfort  Take frequent breaks and rest with your legs elevated if you have leg cramps or low back pain.  Take warm sitz baths to soothe any pain or discomfort caused by hemorrhoids. Use hemorrhoid cream if your health care provider approves.  Wear a good support bra to prevent discomfort from breast tenderness.  If you develop varicose veins: ? Wear support pantyhose or compression stockings as told by your healthcare provider. ? Elevate your feet for 15 minutes, 3-4 times a day. Prenatal care  Write down your questions. Take them to your prenatal visits.  Keep all your prenatal visits as told by your health care provider. This is important. Safety  Wear your seat belt at all times when driving.  Make  a list of emergency phone numbers, including numbers for family, friends, the hospital, and police and fire departments. General instructions  Avoid cat litter boxes and soil used by cats. These carry germs that can cause birth defects in the baby. If you have a cat, ask someone to clean the litter box for you.  Do not travel far distances unless it is absolutely necessary and only with the approval of your health care provider.  Do not use hot tubs, steam rooms, or saunas.  Do not drink alcohol.  Do not use any products that contain nicotine or tobacco, such as cigarettes and e-cigarettes. If you need help quitting, ask your health care provider.  Do not use any medicinal herbs or unprescribed drugs. These chemicals affect the formation and growth of the baby.  Do not douche or use tampons or scented sanitary pads.  Do not cross your legs for long periods of time.  To prepare for the arrival of your baby: ? Take prenatal classes to understand, practice, and ask questions about labor and delivery. ? Make a trial run to the hospital. ? Visit the hospital and tour the maternity area. ? Arrange for maternity or paternity leave through employers. ? Arrange for family and friends to take care of pets while you are in the hospital. ? Purchase a rear-facing car seat and make sure you know how to install it in your car. ? Pack your hospital bag. ? Prepare the baby's nursery. Make sure to remove all pillows and stuffed animals from the baby's crib to prevent suffocation.  Visit your dentist if you have not gone during your pregnancy. Use a soft toothbrush to brush your teeth and be gentle when you floss. Contact a health care provider if:  You are unsure if you are in labor or if your water has broken.  You become dizzy.  You have mild pelvic cramps, pelvic pressure, or nagging pain in your abdominal area.  You have lower back pain.  You have persistent nausea, vomiting, or  diarrhea.  You have an unusual or bad smelling vaginal discharge.  You have pain when you urinate. Get help right away if:  Your water breaks before 37 weeks.  You have regular contractions less than 5 minutes apart before 37 weeks.  You have a fever.  You are leaking fluid from your vagina.  You have spotting or bleeding from your vagina.  You have severe abdominal pain or cramping.  You have rapid weight loss or weight gain.  You have   shortness of breath with chest pain.  You notice sudden or extreme swelling of your face, hands, ankles, feet, or legs.  Your baby makes fewer than 10 movements in 2 hours.  You have severe headaches that do not go away when you take medicine.  You have vision changes. Summary  The third trimester is from week 28 through week 40, months 7 through 9. The third trimester is a time when the unborn baby (fetus) is growing rapidly.  During the third trimester, your discomfort may increase as you and your baby continue to gain weight. You may have abdominal, leg, and back pain, sleeping problems, and an increased need to urinate.  During the third trimester your breasts will keep growing and they will continue to become tender. A yellow fluid (colostrum) may leak from your breasts. This is the first milk you are producing for your baby.  False labor is a condition in which you feel small, irregular tightenings of the muscles in the womb (contractions) that eventually go away. These are called Braxton Hicks contractions. Contractions may last for hours, days, or even weeks before true labor sets in.  Signs of labor can include: abdominal cramps; regular contractions that start at 10 minutes apart and become stronger and more frequent with time; watery or bloody mucus discharge that comes from the vagina; increased pelvic pressure and dull back pain; and leaking of amniotic fluid. This information is not intended to replace advice given to you by your  health care provider. Make sure you discuss any questions you have with your health care provider. Document Revised: 07/10/2018 Document Reviewed: 04/24/2016 Elsevier Patient Education  2020 [employer]  

## 2019-07-07 NOTE — Progress Notes (Signed)
   PRENATAL VISIT NOTE  Subjective:  Donna Huang is a 19 y.o. G1P0 at [redacted]w[redacted]d being seen today for ongoing prenatal care.  She is currently monitored for the following issues for this low-risk pregnancy and has Patellar dislocation; Attention deficit disorder (ADD) without hyperactivity; Dysmenorrhea; Supervision of normal first pregnancy; and Rubella non-immune status, antepartum on their problem list.  Patient reports pain at umbilicus, mild dizziness continues.  Contractions: Not present. Vag. Bleeding: None.  Movement: Present. Denies leaking of fluid.   The following portions of the patient's history were reviewed and updated as appropriate: allergies, current medications, past family history, past medical history, past social history, past surgical history and problem list.   Objective:   Vitals:   07/07/19 1321  BP: 111/69  Pulse: 98  Weight: 175 lb (79.4 kg)    Fetal Status: Fetal Heart Rate (bpm): 138   Movement: Present     General:  Alert, oriented and cooperative. Patient is in no acute distress.  Skin: Skin is warm and dry. No rash noted.   Cardiovascular: Normal heart rate noted  Respiratory: Normal respiratory effort, no problems with respiration noted  Abdomen: Soft, gravid, appropriate for gestational age.  Pain/Pressure: Present     Pelvic: Cervical exam deferred        Extremities: Normal range of motion.  Edema: Trace  Mental Status: Normal mood and affect. Normal behavior. Normal judgment and thought content.   Assessment and Plan:  Pregnancy: G1P0 at 100w1d 1. Postural dizziness with presyncope --Improved since last visit but not resolved. --Continue to eat and drink regularly, get enough protien, especially in the am.  2. Encounter for supervision of normal first pregnancy in third trimester --Anticipatory guidance about next visits/weeks of pregnancy given. --Pain at umbilicus may be umbilical hernia, no obvious distension with abdominal pressure but pain to  palpation at and below umbilicus.  Reassurance provided to pt.  Will follow up PP as needed. --Next visit in 4 weeks in the office for GBS  Preterm labor symptoms and general obstetric precautions including but not limited to vaginal bleeding, contractions, leaking of fluid and fetal movement were reviewed in detail with the patient. Please refer to After Visit Summary for other counseling recommendations.   Return in about 4 weeks (around 08/04/2019).  No future appointments.  Sharen Counter, CNM

## 2019-08-04 ENCOUNTER — Ambulatory Visit (INDEPENDENT_AMBULATORY_CARE_PROVIDER_SITE_OTHER): Payer: Managed Care, Other (non HMO) | Admitting: Certified Nurse Midwife

## 2019-08-04 ENCOUNTER — Other Ambulatory Visit (HOSPITAL_COMMUNITY)
Admission: RE | Admit: 2019-08-04 | Discharge: 2019-08-04 | Disposition: A | Discharge status: Home / Self Care | Payer: Managed Care, Other (non HMO) | Source: Ambulatory Visit | Attending: Certified Nurse Midwife | Admitting: Certified Nurse Midwife

## 2019-08-04 ENCOUNTER — Encounter: Payer: Self-pay | Admitting: Certified Nurse Midwife

## 2019-08-04 ENCOUNTER — Other Ambulatory Visit: Payer: Self-pay

## 2019-08-04 VITALS — BP 104/64 | HR 103 | Wt 183.0 lb

## 2019-08-04 DIAGNOSIS — Z2839 Other underimmunization status: Secondary | ICD-10-CM

## 2019-08-04 DIAGNOSIS — Z283 Underimmunization status: Secondary | ICD-10-CM

## 2019-08-04 DIAGNOSIS — Z3403 Encounter for supervision of normal first pregnancy, third trimester: Secondary | ICD-10-CM | POA: Diagnosis not present

## 2019-08-04 DIAGNOSIS — O99891 Other specified diseases and conditions complicating pregnancy: Secondary | ICD-10-CM

## 2019-08-04 NOTE — Progress Notes (Signed)
   PRENATAL VISIT NOTE  Subjective:  Donna Huang is a 19 y.o. G1P0 at 1w1dbeing seen today for ongoing prenatal care.  She is currently monitored for the following issues for this low-risk pregnancy and has Patellar dislocation; Attention deficit disorder (ADD) without hyperactivity; Dysmenorrhea; Supervision of normal first pregnancy; and Rubella non-immune status, antepartum on their problem list.  Patient reports no complaints.  Contractions: Irritability. Vag. Bleeding: None.  Movement: Present. Denies leaking of fluid.   The following portions of the patient's history were reviewed and updated as appropriate: allergies, current medications, past family history, past medical history, past social history, past surgical history and problem list.   Objective:   Vitals:   08/04/19 1039  BP: 104/64  Pulse: (!) 103  Weight: 183 lb (83 kg)    Fetal Status: Fetal Heart Rate (bpm): 141 Fundal Height: 35 cm Movement: Present  Presentation: Vertex  General:  Alert, oriented and cooperative. Patient is in no acute distress.  Skin: Skin is warm and dry. No rash noted.   Cardiovascular: Normal heart rate noted  Respiratory: Normal respiratory effort, no problems with respiration noted  Abdomen: Soft, gravid, appropriate for gestational age.  Pain/Pressure: Present     Pelvic: Cervical exam deferred        Extremities: Normal range of motion.  Edema: Trace  Mental Status: Normal mood and affect. Normal behavior. Normal judgment and thought content.   Assessment and Plan:  Pregnancy: G1P0 at 335w1d. Encounter for supervision of normal first pregnancy in third trimester - Patient doing well, no complaints - Routine prenatal care - Anticipatory guidance on upcoming appointments with next being mychart at 38 weeks then in person at 39 weeks - Patient reports that last day of work is on the 28th. 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 Encouraged  patient to wear compression socks while at work, increase amount of water she consumes and elevate her legs while at work on break and when she gets off, patient verbalizes understanding  - Educated and discussed use of EPO and RRT for cervical ripening  - Culture, beta strep (group b only) - Cervicovaginal ancillary only( [place])  2. Rubella non-immune status, antepartum - MMR PP   Preterm labor symptoms and general obstetric precautions including but not limited to vaginal bleeding, contractions, leaking of fluid and fetal movement were reviewed in detail with the patient. Please refer to After Visit Summary for other counseling recommendations.   Return in about 2 weeks (around 08/18/2019) for ROB-mychart.  Future Appointments  Date Time Provider De[place]5/18/2021  8:55 AM Leftwich-Kirby, LiKathie DikeCNM [facility]    VeLajean ManesCNM

## 2019-08-04 NOTE — Patient Instructions (Addendum)
Reasons to go to MAU:  1.  Contractions are  5 minutes apart or less, each last 1 minute, these have been going on for 1-2 hours, and you cannot walk or talk during them 2.  You have a large gush of fluid, or a trickle of fluid that will not stop and you have to wear a pad 3.  You have bleeding that is bright red, heavier than spotting--like menstrual bleeding (spotting can be normal in early labor or after a check of your cervix) 4.  You do not feel the baby moving like he/she normally does   Cervical Ripening: May try one or both  Red Raspberry Leaf capsules:  two 300mg or 400mg tablets with each meal, 2-3 times a day  Potential Side Effects Of Raspberry Leaf:  Most women do not experience any side effects from drinking raspberry leaf tea. However, nausea and loose stools are possible   Evening Primrose Oil capsules: may take 1 to 3 capsules daily. May also prick one to release the oil and insert it into your vagina at night.  Some of the potential side effects:  Upset stomach  Loose stools or diarrhea  Headaches  Nausea:    

## 2019-08-05 LAB — CERVICOVAGINAL ANCILLARY ONLY
Chlamydia: NEGATIVE
Comment: NEGATIVE
Comment: NORMAL
Neisseria Gonorrhea: NEGATIVE

## 2019-08-07 LAB — CULTURE, BETA STREP (GROUP B ONLY)
MICRO NUMBER:: 10437749
SPECIMEN QUALITY:: ADEQUATE

## 2019-08-07 LAB — OB RESULTS CONSOLE GBS: GBS: NEGATIVE

## 2019-08-18 ENCOUNTER — Telehealth (INDEPENDENT_AMBULATORY_CARE_PROVIDER_SITE_OTHER): Payer: Managed Care, Other (non HMO) | Admitting: Advanced Practice Midwife

## 2019-08-18 VITALS — BP 122/72

## 2019-08-18 DIAGNOSIS — R102 Pelvic and perineal pain: Secondary | ICD-10-CM

## 2019-08-18 DIAGNOSIS — Z3A38 38 weeks gestation of pregnancy: Secondary | ICD-10-CM

## 2019-08-18 DIAGNOSIS — O26893 Other specified pregnancy related conditions, third trimester: Secondary | ICD-10-CM

## 2019-08-18 DIAGNOSIS — Z3403 Encounter for supervision of normal first pregnancy, third trimester: Secondary | ICD-10-CM

## 2019-08-18 NOTE — Progress Notes (Signed)
   OBSTETRICS PRENATAL VIRTUAL VISIT ENCOUNTER NOTE  Provider location: [facility] for [facility] [place]   I connected with [name] on 08/18/19 at  8:55 AM EDT by MyChart Video Encounter at home and verified that I am speaking with the correct person using two identifiers.   I discussed the limitations, risks, security and privacy concerns of performing an evaluation and management service virtually and the availability of in person appointments. I also discussed with the patient that there may be a patient responsible charge related to this service. The patient expressed understanding and agreed to proceed. Subjective:  Donna Huang is a 19 y.o. G1P0 at [redacted]w[redacted]d being seen today for ongoing prenatal care.  She is currently monitored for the following issues for this low-risk pregnancy and has Patellar dislocation; Attention deficit disorder (ADD) without hyperactivity; Dysmenorrhea; Supervision of normal first pregnancy; and Rubella non-immune status, antepartum on their problem list.  Patient reports occasional contractions and pelvic pressure.  Contractions: Not present. Vag. Bleeding: None.  Movement: Present. Denies any leaking of fluid.   The following portions of the patient's history were reviewed and updated as appropriate: allergies, current medications, past family history, past medical history, past social history, past surgical history and problem list.   Objective:   Vitals:   08/18/19 0850  BP: 122/72    Fetal Status:     Movement: Present     General:  Alert, oriented and cooperative. Patient is in no acute distress.  Respiratory: Normal respiratory effort, no problems with respiration noted  Mental Status: Normal mood and affect. Normal behavior. Normal judgment and thought content.  Rest of physical exam deferred due to type of encounter  Imaging: No results found.  Assessment and Plan:  Pregnancy: G1P0 at [redacted]w[redacted]d 1. Encounter for supervision of normal  first pregnancy in third trimester --Pt reports good fetal movement, denies cramping, LOF, or vaginal bleeding --Anticipatory guidance about next visits/weeks of pregnancy given. --Next visit in 1 week in the office   2. Pelvic pain affecting pregnancy in third trimester, antepartum --Discussed position changes/the [employer] to improve pain/fetal position --Reviewed cervical ripening with EPO and raspberry leaf tea --Rest/ice/heat/warm bath/Tylenol/pregnancy support belt   Term labor symptoms and general obstetric precautions including but not limited to vaginal bleeding, contractions, leaking of fluid and fetal movement were reviewed in detail with the patient. I discussed the assessment and treatment plan with the patient. The patient was provided an opportunity to ask questions and all were answered. The patient agreed with the plan and demonstrated an understanding of the instructions. The patient was advised to call back or seek an in-person office evaluation/go to MAU at [facility] for any urgent or concerning symptoms. Please refer to After Visit Summary for other counseling recommendations.   I provided 10 minutes of face-to-face time during this encounter.  Return in about 1 week (around 08/25/2019).  Future Appointments  Date Time Provider Department Center  08/26/2019 11:10 AM Rasch, Harolyn Rutherford, NP [facility]    Sharen Counter, CNM Center for [employer], [facility]

## 2019-08-26 ENCOUNTER — Other Ambulatory Visit: Payer: Self-pay

## 2019-08-26 ENCOUNTER — Ambulatory Visit (INDEPENDENT_AMBULATORY_CARE_PROVIDER_SITE_OTHER): Payer: Managed Care, Other (non HMO) | Admitting: Obstetrics and Gynecology

## 2019-08-26 ENCOUNTER — Other Ambulatory Visit: Payer: Self-pay | Admitting: Advanced Practice Midwife

## 2019-08-26 VITALS — BP 133/81 | HR 106

## 2019-08-26 DIAGNOSIS — Z3A39 39 weeks gestation of pregnancy: Secondary | ICD-10-CM

## 2019-08-26 DIAGNOSIS — Z3403 Encounter for supervision of normal first pregnancy, third trimester: Secondary | ICD-10-CM

## 2019-08-26 NOTE — Progress Notes (Signed)
   PRENATAL VISIT NOTE  Subjective:  Donna Huang is a 19 y.o. G1P0 at [redacted]w[redacted]d being seen today for ongoing prenatal care.  She is currently monitored for the following issues for this low-risk pregnancy and has Patellar dislocation; Attention deficit disorder (ADD) without hyperactivity; Dysmenorrhea; Supervision of normal first pregnancy; and Rubella non-immune status, antepartum on their problem list.  Patient reports no complaints.  Contractions: Irritability. Vag. Bleeding: None.  Movement: Present. Denies leaking of fluid.   The following portions of the patient's history were reviewed and updated as appropriate: allergies, current medications, past family history, past medical history, past social history, past surgical history and problem list.   Objective:   Vitals:   08/26/19 1108  BP: 133/81  Pulse: (!) 106    Fetal Status: Fetal Heart Rate (bpm): 154 Fundal Height: 39 cm Movement: Present  Presentation: Vertex  General:  Alert, oriented and cooperative. Patient is in no acute distress.  Skin: Skin is warm and dry. No rash noted.   Cardiovascular: Normal heart rate noted  Respiratory: Normal respiratory effort, no problems with respiration noted  Abdomen: Soft, gravid, appropriate for gestational age.  Pain/Pressure: Present     Pelvic: Cervical exam performed in the presence of a chaperone Dilation: 1.5 Effacement (%): Thick  Membrane sweep per patient request and verbal consent.   Extremities: Normal range of motion.  Edema: Trace  Mental Status: Normal mood and affect. Normal behavior. Normal judgment and thought content.   Assessment and Plan:  Pregnancy: G1P0 at [redacted]w[redacted]d  1. Encounter for supervision of normal first pregnancy in third trimester  Post dates induction scheduled Patient to arrive to the office for foley insertion and NST.   Preterm labor symptoms and general obstetric precautions including but not limited to vaginal bleeding, contractions, leaking of fluid  and fetal movement were reviewed in detail with the patient. Please refer to After Visit Summary for other counseling recommendations.   Return for 6/1 in the afternoon for NST and foley insertion .  Future Appointments  Date Time Provider Department Center  08/29/2019 11:30 AM [facility]ENING [facility] None  09/01/2019  1:00 PM Hurshel Party, CNM [facility]  09/02/2019 12:00 AM [facility]-LD SCHED ROOM [facility] None    Venia Carbon, NP

## 2019-08-26 NOTE — Patient Instructions (Signed)

## 2019-08-27 ENCOUNTER — Encounter (HOSPITAL_COMMUNITY): Payer: Self-pay | Admitting: *Deleted

## 2019-08-27 ENCOUNTER — Telehealth (HOSPITAL_COMMUNITY): Payer: Self-pay | Admitting: *Deleted

## 2019-08-27 NOTE — Telephone Encounter (Signed)
Preadmission screen  

## 2019-08-28 ENCOUNTER — Inpatient Hospital Stay (HOSPITAL_COMMUNITY)
Admission: AD | Admit: 2019-08-28 | Discharge: 2019-08-28 | Disposition: A | Discharge status: Home / Self Care | Payer: Managed Care, Other (non HMO) | Attending: Obstetrics and Gynecology | Admitting: Obstetrics and Gynecology

## 2019-08-28 ENCOUNTER — Encounter (HOSPITAL_COMMUNITY): Payer: Self-pay | Admitting: Obstetrics and Gynecology

## 2019-08-28 ENCOUNTER — Other Ambulatory Visit: Payer: Self-pay

## 2019-08-28 DIAGNOSIS — Z3A39 39 weeks gestation of pregnancy: Secondary | ICD-10-CM

## 2019-08-28 DIAGNOSIS — Z3689 Encounter for other specified antenatal screening: Secondary | ICD-10-CM

## 2019-08-28 DIAGNOSIS — O471 False labor at or after 37 completed weeks of gestation: Secondary | ICD-10-CM | POA: Diagnosis not present

## 2019-08-28 NOTE — MAU Note (Signed)
Pt here with reports of contractions. Pt reports that the contractions are not "timeable" but come for a period of time and then go away for a time. Pt reports she feels pain in belly and in back. Pt denies LOF or vaginal bleeding. Reports mucous plug did come out. Pt reports that baby has been moving but not as much as she usually does. Reports she last felt baby move about 1.5 hours ago. Pt reports she was about 1.5-2cm on Wednesday and had membranes stripped.

## 2019-08-28 NOTE — MAU Note (Signed)
I have communicated with Gerrit Heck, CNM and reviewed vital signs:  Vitals:   08/28/19 2031 08/28/19 2149  BP: (!) 113/53 107/71  Pulse: (!) 104   Resp:    Temp:    SpO2:      Vaginal exam:  Dilation: 2.5 Effacement (%): 40 Cervical Position: Middle Station: Ballotable Presentation: Vertex Exam by:: TLYTLE RN ,   Also reviewed contraction pattern and that non-stress test is reactive.  It has been documented that patient is contracting irregularly minutes with no cervical change over 1 hour not indicating active labor.  Patient denies any other complaints.  Based on this report provider has given order for discharge.  A discharge order and diagnosis entered by a provider.   Labor discharge instructions reviewed with patient.

## 2019-08-28 NOTE — OB Triage Provider Note (Signed)
S: Ms. Renata Gambino is a 19 y.o. G1P0 at [redacted]w[redacted]d  who presents to MAU today for labor evaluation.   After nurse evaluation patient without cervical change.   Cervical exam by RN:  Dilation: 2.5 Effacement (%): 40 Cervical Position: Middle Station: Ballotable Presentation: Vertex Exam by:: TLYTLE RN   Fetal Monitoring: Baseline: 130 Variability: Moderate Accelerations: Present Decelerations: None Contractions: Irritability  MDM Discussed patient with RN. NST reviewed.   A: SIUP at [redacted]w[redacted]d  False labor Cat I FT  P: Discharge home Labor precautions and kick counts included in AVS Patient to follow-up with primary ob as scheduled  Patient may return to MAU as needed or when in labor   Gerrit Heck, [place] 08/28/2019 9:57 PM

## 2019-08-29 ENCOUNTER — Other Ambulatory Visit (HOSPITAL_COMMUNITY): Payer: Managed Care, Other (non HMO)

## 2019-08-29 ENCOUNTER — Other Ambulatory Visit (HOSPITAL_COMMUNITY)
Admission: RE | Admit: 2019-08-29 | Discharge: 2019-08-29 | Disposition: A | Discharge status: Home / Self Care | Payer: Managed Care, Other (non HMO) | Source: Ambulatory Visit | Attending: Obstetrics and Gynecology | Admitting: Obstetrics and Gynecology

## 2019-08-29 DIAGNOSIS — Z01812 Encounter for preprocedural laboratory examination: Secondary | ICD-10-CM | POA: Insufficient documentation

## 2019-08-29 DIAGNOSIS — Z20822 Contact with and (suspected) exposure to covid-19: Secondary | ICD-10-CM | POA: Insufficient documentation

## 2019-08-29 LAB — SARS CORONAVIRUS 2 (TAT 6-24 HRS): SARS Coronavirus 2: NEGATIVE

## 2019-09-01 ENCOUNTER — Other Ambulatory Visit: Payer: Self-pay

## 2019-09-01 ENCOUNTER — Ambulatory Visit (INDEPENDENT_AMBULATORY_CARE_PROVIDER_SITE_OTHER): Payer: Managed Care, Other (non HMO) | Admitting: Advanced Practice Midwife

## 2019-09-01 DIAGNOSIS — Z3A40 40 weeks gestation of pregnancy: Secondary | ICD-10-CM

## 2019-09-01 DIAGNOSIS — O480 Post-term pregnancy: Secondary | ICD-10-CM

## 2019-09-01 DIAGNOSIS — Z3403 Encounter for supervision of normal first pregnancy, third trimester: Secondary | ICD-10-CM

## 2019-09-01 NOTE — Progress Notes (Signed)
   PRENATAL VISIT NOTE  Subjective:  Donna Huang is a 19 y.o. G1P0 at [redacted]w[redacted]d being seen today for ongoing prenatal care.  She is currently monitored for the following issues for this low-risk pregnancy and has Patellar dislocation; Attention deficit disorder (ADD) without hyperactivity; Dysmenorrhea; Supervision of normal first pregnancy; and Rubella non-immune status, antepartum on their problem list.  Patient reports occasional contractions.  Contractions: Irritability. Vag. Bleeding: None.  Movement: Present. Denies leaking of fluid.   The following portions of the patient's history were reviewed and updated as appropriate: allergies, current medications, past family history, past medical history, past social history, past surgical history and problem list.   Objective:  There were no vitals filed for this visit.  Fetal Status: Fetal Heart Rate (bpm): NST-R Fundal Height: 40 cm Movement: Present  Presentation: Vertex  General:  Alert, oriented and cooperative. Patient is in no acute distress.  Skin: Skin is warm and dry. No rash noted.   Cardiovascular: Normal heart rate noted  Respiratory: Normal respiratory effort, no problems with respiration noted  Abdomen: Soft, gravid, appropriate for gestational age.  Pain/Pressure: Present     Pelvic: Cervical exam performed in the presence of a chaperone Dilation: 2.5 Effacement (%): 70 Station: -2  Extremities: Normal range of motion.     Mental Status: Normal mood and affect. Normal behavior. Normal judgment and thought content.   Assessment and Plan:  Pregnancy: G1P0 at [redacted]w[redacted]d 1. Encounter for supervision of normal first pregnancy in third trimester --IOL scheduled at midnight tonight --Doing well, good fetal movement, no regular contractions  2. Post term pregnancy over 40 weeks --Pt preferred IOL as soon as possible so was scheduled at [redacted]w[redacted]d.   --Favorable cervix at 2.5 cm --Membranes swept in office today (pt declined foley bulb  today) and to come home and do [employer] positions.   --IOl at midnight tonight  Term labor symptoms and general obstetric precautions including but not limited to vaginal bleeding, contractions, leaking of fluid and fetal movement were reviewed in detail with the patient. Please refer to After Visit Summary for other counseling recommendations.   No follow-ups on file.  Future Appointments  Date Time Provider Department Center  09/02/2019 12:00 AM [facility] SCHED ROOM [facility] None    Sharen Counter, CNM

## 2019-09-02 ENCOUNTER — Encounter (HOSPITAL_COMMUNITY): Payer: Self-pay | Admitting: Obstetrics and Gynecology

## 2019-09-02 ENCOUNTER — Inpatient Hospital Stay (HOSPITAL_COMMUNITY)
Admission: AD | Admit: 2019-09-02 | Discharge: 2019-09-05 | DRG: 807 | Disposition: A | Discharge status: Home / Self Care | Payer: Managed Care, Other (non HMO) | Attending: Obstetrics & Gynecology | Admitting: Obstetrics & Gynecology

## 2019-09-02 ENCOUNTER — Inpatient Hospital Stay (HOSPITAL_COMMUNITY): Payer: Managed Care, Other (non HMO)

## 2019-09-02 DIAGNOSIS — Z20822 Contact with and (suspected) exposure to covid-19: Secondary | ICD-10-CM | POA: Diagnosis present

## 2019-09-02 DIAGNOSIS — Z3A40 40 weeks gestation of pregnancy: Secondary | ICD-10-CM

## 2019-09-02 DIAGNOSIS — O09899 Supervision of other high risk pregnancies, unspecified trimester: Secondary | ICD-10-CM

## 2019-09-02 DIAGNOSIS — Z2839 Other underimmunization status: Secondary | ICD-10-CM

## 2019-09-02 DIAGNOSIS — Z3400 Encounter for supervision of normal first pregnancy, unspecified trimester: Secondary | ICD-10-CM

## 2019-09-02 DIAGNOSIS — Z6791 Unspecified blood type, Rh negative: Secondary | ICD-10-CM | POA: Diagnosis not present

## 2019-09-02 DIAGNOSIS — O26893 Other specified pregnancy related conditions, third trimester: Secondary | ICD-10-CM | POA: Diagnosis present

## 2019-09-02 DIAGNOSIS — O480 Post-term pregnancy: Principal | ICD-10-CM | POA: Diagnosis present

## 2019-09-02 DIAGNOSIS — O26899 Other specified pregnancy related conditions, unspecified trimester: Secondary | ICD-10-CM

## 2019-09-02 LAB — CBC
HCT: 37.2 % (ref 36.0–46.0)
Hemoglobin: 12.9 g/dL (ref 12.0–15.0)
MCH: 33.8 pg (ref 26.0–34.0)
MCHC: 34.7 g/dL (ref 30.0–36.0)
MCV: 97.4 fL (ref 80.0–100.0)
Platelets: 160 10*3/uL (ref 150–400)
RBC: 3.82 MIL/uL — ABNORMAL LOW (ref 3.87–5.11)
RDW: 12.1 % (ref 11.5–15.5)
WBC: 14.1 10*3/uL — ABNORMAL HIGH (ref 4.0–10.5)
nRBC: 0 % (ref 0.0–0.2)

## 2019-09-02 LAB — TYPE AND SCREEN
ABO/RH(D): O NEG
Antibody Screen: POSITIVE

## 2019-09-02 LAB — RPR: RPR Ser Ql: NONREACTIVE

## 2019-09-02 MED ORDER — LACTATED RINGERS IV SOLN
500.0000 mL | INTRAVENOUS | Status: DC | PRN
  Administered 2019-09-03: 500 mL via INTRAVENOUS

## 2019-09-02 MED ORDER — LIDOCAINE HCL (PF) 1 % IJ SOLN: 30.0000 mL | INTRAMUSCULAR | Status: DC | PRN

## 2019-09-02 MED ORDER — OXYCODONE-ACETAMINOPHEN 5-325 MG PO TABS: 2.0000 | ORAL_TABLET | ORAL | Status: DC | PRN

## 2019-09-02 MED ORDER — OXYCODONE-ACETAMINOPHEN 5-325 MG PO TABS: 1.0000 | ORAL_TABLET | ORAL | Status: DC | PRN

## 2019-09-02 MED ORDER — ONDANSETRON HCL 4 MG/2ML IJ SOLN: 4.0000 mg | Freq: Four times a day (QID) | INTRAMUSCULAR | Status: DC | PRN

## 2019-09-02 MED ORDER — LACTATED RINGERS IV SOLN: INTRAVENOUS | Status: DC

## 2019-09-02 MED ORDER — SOD CITRATE-CITRIC ACID 500-334 MG/5ML PO SOLN: 30.0000 mL | ORAL | Status: DC | PRN

## 2019-09-02 MED ORDER — MISOPROSTOL 100 MCG PO TABS
25.0000 ug | ORAL_TABLET | ORAL | Status: DC
  Administered 2019-09-02: 25 ug via VAGINAL
  Filled 2019-09-02: qty 1

## 2019-09-02 MED ORDER — MISOPROSTOL 50MCG HALF TABLET
50.0000 ug | ORAL_TABLET | ORAL | Status: DC
  Administered 2019-09-02 – 2019-09-03 (×2): 50 ug via BUCCAL
  Filled 2019-09-02: qty 1

## 2019-09-02 MED ORDER — OXYTOCIN-SODIUM CHLORIDE 30-0.9 UT/500ML-% IV SOLN
1.0000 m[IU]/min | INTRAVENOUS | Status: DC
  Administered 2019-09-02 – 2019-09-03 (×2): 2 m[IU]/min via INTRAVENOUS
  Filled 2019-09-02: qty 500

## 2019-09-02 MED ORDER — MISOPROSTOL 25 MCG QUARTER TABLET
ORAL_TABLET | ORAL | Status: AC
  Administered 2019-09-02: 25 ug via VAGINAL
  Filled 2019-09-02: qty 1

## 2019-09-02 MED ORDER — TERBUTALINE SULFATE 1 MG/ML IJ SOLN: 0.2500 mg | Freq: Once | INTRAMUSCULAR | Status: DC | PRN

## 2019-09-02 MED ORDER — OXYTOCIN BOLUS FROM INFUSION
500.0000 mL | Freq: Once | INTRAVENOUS | Status: AC
  Administered 2019-09-03: 500 mL via INTRAVENOUS

## 2019-09-02 MED ORDER — OXYTOCIN-SODIUM CHLORIDE 30-0.9 UT/500ML-% IV SOLN
2.5000 [IU]/h | INTRAVENOUS | Status: DC
  Administered 2019-09-03: 2.5 [IU]/h via INTRAVENOUS
  Filled 2019-09-02 (×2): qty 500

## 2019-09-02 MED ORDER — ACETAMINOPHEN 325 MG PO TABS
650.0000 mg | ORAL_TABLET | ORAL | Status: DC | PRN
  Administered 2019-09-03: 650 mg via ORAL
  Filled 2019-09-02: qty 2

## 2019-09-02 MED ORDER — MISOPROSTOL 50MCG HALF TABLET
ORAL_TABLET | ORAL | Status: AC
  Filled 2019-09-02: qty 1

## 2019-09-02 NOTE — Progress Notes (Signed)
LABOR PROGRESS NOTE  Donna Huang is a 19 y.o. G1P0 at [redacted]w[redacted]d  admitted for IOL due to post dates GA.  Subjective: Patient reports feeling well and has been ambulating well.   Objective: BP (!) 92/57   Pulse 81   Temp 98 F (36.7 C) (Oral)   Resp 18   Ht 5\' 3"  (1.6 m)   Wt 87.8 kg   LMP 12/08/2018   BMI 34.28 kg/m  or  Vitals:   09/02/19 0433 09/02/19 0449 09/02/19 0514 09/02/19 0531  BP: 107/73  104/77 (!) 92/57  Pulse: (!) 107  (!) 111 81  Resp: 18  18 18   Temp:  98 F (36.7 C)    TempSrc:  Oral    Weight:      Height:       Dilation: 4 Effacement (%): 70 Cervical Position: Middle Station: -2 Presentation: Vertex Exam by:: , RN FHT: baseline rate 130, moderate varibility, +acel, no decel Toco: every 2-4 mins   Labs: Lab Results  Component Value Date   WBC 14.1 (H) 09/02/2019   HGB 12.9 09/02/2019   HCT 37.2 09/02/2019   MCV 97.4 09/02/2019   PLT 160 09/02/2019    Patient Active Problem List   Diagnosis Date Noted  . Rh negative state in antepartum period 09/02/2019  . Post-dates pregnancy 09/02/2019  . Rubella non-immune status, antepartum 06/18/2019  . Supervision of normal first pregnancy 02/17/2019  . 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 Dysmenorrhea 02/10/2016  . Patellar dislocation 03/04/2014    Assessment / Plan: 19 y.o. G1P0 at [redacted]w[redacted]d here for IOL for post dates.  Labor: s/p PO cytotec x1, now on pitocin and progressing well  Fetal Wellbeing:  catgory I  Pain Control:  GBS: negative  Anticipated MOD:  SVD   12, MD Family Medicine, PGY-1  09/02/2019, 5:42 AM

## 2019-09-02 NOTE — Progress Notes (Addendum)
Donna Huang is a 18 y.o. G1P0 at [redacted]w[redacted]d by ultrasound admitted for induction of labor due to Post dates.  Subjective: Feeling well.  Not really feeling contractions.  No other concerns.   Objective: BP (!) 101/56   Pulse 82   Temp 98.2 F (36.8 C) (Oral)   Resp 20   Ht 5' 3" (1.6 m)   Wt 87.8 kg   LMP 12/08/2018   BMI 34.28 kg/m  No intake/output data recorded. No intake/output data recorded.  FHT:  FHR: 135 bpm, variability: moderate,  accelerations:  Present,  decelerations:  Absent UC:   irregular, every 3-6 minutes SVE:   Dilation: 4 Effacement (%): 60, 70 Station: -2 Exam by:: Amber Knox, RN   Labs: Lab Results  Component Value Date   WBC 14.1 (H) 09/02/2019   HGB 12.9 09/02/2019   HCT 37.2 09/02/2019   MCV 97.4 09/02/2019   PLT 160 09/02/2019    Assessment / Plan: Induction of labor due to postterm,  progressing well on pitocin  Labor: Progressing on Pitocin, will continue to increase then AROM Fetal Wellbeing:  Category I Pain Control:  IV pain meds and epidural PRN I/D:   Rh neg: Rhogram eval PP (received rhogam 06/05/2019).  Rubella non-immune: MMR PP Anticipated MOD:   VD  EMILY M GREEN, MD PGY-2 Resident Family Medicine 09/02/2019, 08:15 AM  GME ATTESTATION:  I saw and evaluated the patient. I agree with the findings and the plan of care as documented in the resident's note.  [name], DO OB Fellow, Faculty Practice [place], [facility] 09/02/2019 11:19 AM  

## 2019-09-02 NOTE — Progress Notes (Addendum)
Patient ID: Donna Huang, female   DOB: 03/03/2001, 19 y.o.   MRN: [id number]   Donna Huang is a 19 y.o. G1P0 at [redacted]w[redacted]d admitted for induction of labor 2/2 post dates.  Subjective: No complaints.  No contractions or pain.  Objective: BP 107/62   Pulse 96   Temp 98.6 F (37 C) (Oral)   Resp 18   Ht 5\' 3"  (1.6 m)   Wt 87.8 kg   LMP 12/08/2018   BMI 34.28 kg/m  No intake/output data recorded.  FHT:  FHR: 125 bpm, variability: moderate,  accelerations:  Present,  decelerations:  Absent UC:   regular, every 5-6 minutes  SVE:   Dilation: 3.5 Effacement (%): 70 Station: -2 Exam by:: [email or web address], MD  Pitocin off  Labs: Lab Results  Component Value Date   WBC 14.1 (H) 09/02/2019   HGB 12.9 09/02/2019   HCT 37.2 09/02/2019   MCV 97.4 09/02/2019   PLT 160 09/02/2019    Assessment / Plan: Induction of labor due to postterm Cytotec #2 placed with this check  Labor:  Cytotec #2 placed, continue expectant management Fetal Wellbeing:  Category I Pain Control:  Labor support without medications I/D:  GBS negative Anticipated MOD:   Vaginal  EMILY 11/02/2019, MD PGY-2 Resident Family Medicine 09/02/2019, 8:00 PM  GME ATTESTATION:  I saw and evaluated the patient. I agree with the findings and the plan of care as documented in the resident's note.  11/02/2019, DO OB Fellow, Faculty [facility], [facility] 09/02/2019 8:10 PM

## 2019-09-02 NOTE — Progress Notes (Signed)
Donna Huang is a 19 y.o. G1P0 at [redacted]w[redacted]d admitted for IOL 2/2 post dates  Subjective: Not feeling any contractions.  Objective: BP 108/70   Pulse 90   Temp 98.6 F (37 C) (Oral)   Resp 20   Ht 5\' 3"  (1.6 m)   Wt 87.8 kg   LMP 12/08/2018   BMI 34.28 kg/m  No intake/output data recorded.  FHT:  FHR: 140 bpm, variability: moderate,  accelerations:  Present,  decelerations:  Absent UC:   Not well picked up by TOCO  SVE:   Dilation: 3 Effacement (%): 70 Station: -2 Exam by:: Dr. [email or web address]  Pitocin @ 18 mu/min  Labs: Lab Results  Component Value Date   WBC 14.1 (H) 09/02/2019   HGB 12.9 09/02/2019   HCT 37.2 09/02/2019   MCV 97.4 09/02/2019   PLT 160 09/02/2019    Assessment / Plan: 19 yo G1P0 at 40.2 here for IOL 2/2 post dates  Labor: On pitocin for >12 hours. No progress thus far. Will stop pit and give cytotec. Fetal Wellbeing:  Category I Pain Control:  per patient request I/D:  GBS negative Anticipated MOD:  vaginal  Jacquelinne Speak L Jamyiah Labella DO OB Fellow, Faculty Practice 09/02/2019, 2:35 PM

## 2019-09-02 NOTE — H&P (Addendum)
LABOR AND DELIVERY ADMISSION HISTORY AND PHYSICAL NOTE  Rhayne Chatwin is a 19 y.o. female G48P0 with IUP at 13w2dby 10wk UKoreapresenting for PD IOL.   She reports positive fetal movement. She denies leakage of fluid, vaginal bleeding, or contractions.   She plans on breast and bottle feeding. Her contraception plan is: unsure.  Prenatal History/Complications: [facility][redacted[facility]d , CWD, normal anatomy, cephalic presentation, anterior placenta, 65%ile, EFW [id number] Pregnancy complications:  - Rubella NI - Rh neg, received rhogam 06/05/2019  Past Medical History: Past Medical History:  Diagnosis Date  . ADD (attention deficit disorder)   . ADHD (attention deficit hyperactivity disorder)     Past Surgical History: Past Surgical History:  Procedure Laterality Date  . KNEE SURGERY Left   . TONSILLECTOMY      Obstetrical History: OB History    Gravida  1   Para      Term      Preterm      AB      Living        SAB      TAB      Ectopic      Multiple      Live Births              Social History: Social History   Socioeconomic History  . Marital status: Single    Spouse name: Not on file  . Number of children: Not on file  . Years of education: Not on file  . Highest education level: Not on file  Occupational History  . Not on file  Tobacco Use  . Smoking status: Never Smoker  . Smokeless tobacco: Never Used  Substance and Sexual Activity  . Alcohol use: No  . Drug use: No  . Sexual activity: Yes    Birth control/protection: None  Other Topics Concern  . Not on file  Social History Narrative  . Not on file   Social Determinants of Health   Financial Resource Strain:   . Difficulty of Paying Living Expenses:   Food Insecurity:   . Worried About RCharity fundraiserin the Last Year:   . RArboriculturistin the Last Year:   Transportation Needs:   . LFilm/video editor(Medical):   .Marland KitchenLack of Transportation (Non-Medical):    Physical Activity:   . Days of Exercise per Week:   . Minutes of Exercise per Session:   Stress:   . Feeling of Stress :   Social Connections:   . Frequency of Communication with Friends and Family:   . Frequency of Social Gatherings with Friends and Family:   . Attends Religious Services:   . Active Member of Clubs or Organizations:   . Attends CArchivistMeetings:   .Marland KitchenMarital Status:     Family History: No family history on file.  Allergies: No Known Allergies  Medications Prior to Admission  Medication Sig Dispense Refill Last Dose  . Prenatal Vit-Fe Fumarate-FA (PRENATAL VITAMINS PO) Take 1 capsule by mouth daily.        Review of Systems  All systems reviewed and negative except as stated in HPI  Physical Exam Last menstrual period 12/08/2018. General appearance: alert, oriented, NAD Lungs: normal respiratory effort Heart: regular rate Abdomen: soft, non-tender; gravid, leopolds 3300g Extremities: No calf swelling or tenderness Presentation: cephalic by SVE  Fetal monitoringBaseline: 120 bpm, Variability: Good {> 6 bpm), Accelerations: Reactive and Decelerations: Absent Uterine  activityNone     Prenatal labs: ABO, Rh: O/RH(D) NEGATIVE/-- (11/17 0852) Antibody: NO ANTIBODIES DETECTED (03/05 0933) Rubella: <0.90 (11/17 0852) RPR: NON-REACTIVE (03/05 0927)  HBsAg: NON-REACTIVE (11/17 0852)  HIV: NON-REACTIVE (03/05 0927)  GC/Chlamydia: neg/neg 08/04/2019  GBS:   Negative 08/04/2019 2-hr GTT: normal 06/05/2019 ([phone]) Genetic screening:  Low risk Panorama Anatomy US: normal  Prenatal Transfer Tool  Maternal Diabetes: No Genetic Screening: Normal Maternal Ultrasounds/Referrals: Normal Fetal Ultrasounds or other Referrals:  None Maternal Substance Abuse:  No Significant Maternal Medications:  None Significant Maternal Lab Results: Group B Strep negative and Rh negative  No results found for this or any previous visit (from the past 24  hour(s)).  Patient Active Problem List   Diagnosis Date Noted  . Rh negative state in antepartum period 09/02/2019  . Rubella non-immune status, antepartum 06/18/2019  . Supervision of normal first pregnancy 02/17/2019  . 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 Dysmenorrhea 02/10/2016  . Patellar dislocation 03/04/2014    Assessment: Craig Wisnewski is a 19 y.o. G1P0 at 80w2dhere for PD IOL.   #Labor: Initial exam favorable. Begin induction with pitocin 2x2.  #Pain: IV pain meds PRN, epidural upon request #FWB: Cat I #GBS/ID: negative #COVID: swab negative 08/29/2019 #MOF: Breast and bottle #MOC: Unsure #Circ: N/a  #Rh neg: rhogam eval PP, received rhogam 06/05/2019  #Rubella NI: MMR PP   MAnnice NeedyEBaylor [facility] & [facility]6/05/2019, 12:05 AM

## 2019-09-03 ENCOUNTER — Encounter (HOSPITAL_COMMUNITY): Payer: Self-pay | Admitting: Obstetrics and Gynecology

## 2019-09-03 ENCOUNTER — Inpatient Hospital Stay (HOSPITAL_COMMUNITY): Payer: Managed Care, Other (non HMO) | Admitting: Anesthesiology

## 2019-09-03 DIAGNOSIS — Z3A40 40 weeks gestation of pregnancy: Secondary | ICD-10-CM

## 2019-09-03 DIAGNOSIS — O480 Post-term pregnancy: Secondary | ICD-10-CM

## 2019-09-03 LAB — URINALYSIS, ROUTINE W REFLEX MICROSCOPIC
Bilirubin Urine: NEGATIVE
Glucose, UA: NEGATIVE mg/dL
Hgb urine dipstick: NEGATIVE
Ketones, ur: NEGATIVE mg/dL
Leukocytes,Ua: NEGATIVE
Nitrite: NEGATIVE
Protein, ur: NEGATIVE mg/dL
Specific Gravity, Urine: 1.008 (ref 1.005–1.030)
pH: 7 (ref 5.0–8.0)

## 2019-09-03 MED ORDER — ACETAMINOPHEN 325 MG PO TABS
650.0000 mg | ORAL_TABLET | Freq: Four times a day (QID) | ORAL | Status: DC | PRN
  Administered 2019-09-04: 650 mg via ORAL
  Filled 2019-09-03: qty 2

## 2019-09-03 MED ORDER — TETANUS-DIPHTH-ACELL PERTUSSIS 5-2.5-18.5 LF-MCG/0.5 IM SUSP: 0.5000 mL | Freq: Once | INTRAMUSCULAR | Status: DC

## 2019-09-03 MED ORDER — FENTANYL CITRATE (PF) 100 MCG/2ML IJ SOLN
100.0000 ug | INTRAMUSCULAR | Status: DC | PRN
  Administered 2019-09-03 (×3): 100 ug via INTRAVENOUS
  Filled 2019-09-03 (×3): qty 2

## 2019-09-03 MED ORDER — ONDANSETRON HCL 4 MG/2ML IJ SOLN: 4.0000 mg | INTRAMUSCULAR | Status: DC | PRN

## 2019-09-03 MED ORDER — PHENYLEPHRINE 40 MCG/ML (10ML) SYRINGE FOR IV PUSH (FOR BLOOD PRESSURE SUPPORT): 80.0000 ug | PREFILLED_SYRINGE | INTRAVENOUS | Status: DC | PRN

## 2019-09-03 MED ORDER — ONDANSETRON HCL 4 MG PO TABS: 4.0000 mg | ORAL_TABLET | ORAL | Status: DC | PRN

## 2019-09-03 MED ORDER — IBUPROFEN 600 MG PO TABS
600.0000 mg | ORAL_TABLET | Freq: Three times a day (TID) | ORAL | Status: DC | PRN
  Administered 2019-09-03 – 2019-09-05 (×5): 600 mg via ORAL
  Filled 2019-09-03 (×5): qty 1

## 2019-09-03 MED ORDER — SIMETHICONE 80 MG PO CHEW: 80.0000 mg | CHEWABLE_TABLET | ORAL | Status: DC | PRN

## 2019-09-03 MED ORDER — MEASLES, MUMPS & RUBELLA VAC IJ SOLR
0.5000 mL | Freq: Once | INTRAMUSCULAR | Status: AC
  Administered 2019-09-05: 0.5 mL via SUBCUTANEOUS
  Filled 2019-09-03: qty 0.5

## 2019-09-03 MED ORDER — LACTATED RINGERS IV SOLN
500.0000 mL | Freq: Once | INTRAVENOUS | Status: AC
  Administered 2019-09-03: 500 mL via INTRAVENOUS

## 2019-09-03 MED ORDER — FENTANYL-BUPIVACAINE-NACL 0.5-0.125-0.9 MG/250ML-% EP SOLN
12.0000 mL/h | EPIDURAL | Status: DC | PRN
  Filled 2019-09-03: qty 250

## 2019-09-03 MED ORDER — DIBUCAINE (PERIANAL) 1 % EX OINT: 1.0000 "application " | TOPICAL_OINTMENT | CUTANEOUS | Status: DC | PRN

## 2019-09-03 MED ORDER — COCONUT OIL OIL: 1.0000 "application " | TOPICAL_OIL | Status: DC | PRN

## 2019-09-03 MED ORDER — EPHEDRINE 5 MG/ML INJ: 10.0000 mg | INTRAVENOUS | Status: DC | PRN

## 2019-09-03 MED ORDER — SENNOSIDES-DOCUSATE SODIUM 8.6-50 MG PO TABS
2.0000 | ORAL_TABLET | ORAL | Status: DC
  Administered 2019-09-03 – 2019-09-04 (×2): 2 via ORAL
  Filled 2019-09-03 (×2): qty 2

## 2019-09-03 MED ORDER — PRENATAL MULTIVITAMIN CH
1.0000 | ORAL_TABLET | Freq: Every day | ORAL | Status: DC
  Administered 2019-09-04: 1 via ORAL
  Filled 2019-09-03: qty 1

## 2019-09-03 MED ORDER — BENZOCAINE-MENTHOL 20-0.5 % EX AERO
1.0000 "application " | INHALATION_SPRAY | CUTANEOUS | Status: DC | PRN
  Administered 2019-09-04: 1 via TOPICAL
  Filled 2019-09-03: qty 56

## 2019-09-03 MED ORDER — SODIUM CHLORIDE (PF) 0.9 % IJ SOLN
INTRAMUSCULAR | Status: DC | PRN
  Administered 2019-09-03: 12 mL/h via EPIDURAL

## 2019-09-03 MED ORDER — WITCH HAZEL-GLYCERIN EX PADS: 1.0000 "application " | MEDICATED_PAD | CUTANEOUS | Status: DC | PRN

## 2019-09-03 MED ORDER — DIPHENHYDRAMINE HCL 50 MG/ML IJ SOLN: 12.5000 mg | INTRAMUSCULAR | Status: DC | PRN

## 2019-09-03 MED ORDER — DIPHENHYDRAMINE HCL 25 MG PO CAPS: 25.0000 mg | ORAL_CAPSULE | Freq: Four times a day (QID) | ORAL | Status: DC | PRN

## 2019-09-03 MED ORDER — LIDOCAINE HCL (PF) 1 % IJ SOLN
INTRAMUSCULAR | Status: DC | PRN
  Administered 2019-09-03: 2 mL via EPIDURAL
  Administered 2019-09-03: 10 mL via EPIDURAL

## 2019-09-03 NOTE — Progress Notes (Signed)
LABOR PROGRESS NOTE  Donna Huang is a 19 y.o. G1P0 at [redacted]w[redacted]d  admitted for IOL 2/2 to post dates.  Subjective: Feeling a little more uncomfortable after SROM. Met patient and discussed plan.   Objective: BP (!) 97/55   Pulse 92   Temp 98.2 F (36.8 C) (Oral)   Resp 18   Ht 5' 3" (1.6 m)   Wt 87.8 kg   LMP 12/08/2018   BMI 34.28 kg/m  or  Vitals:   09/03/19 0616 09/03/19 0753 09/03/19 0845 09/03/19 0915  BP:  114/65 (!) 95/52 (!) 97/55  Pulse:  (!) 111 86 92  Resp:  18 18 18  Temp: 98.5 F (36.9 C) 98.2 F (36.8 C)    TempSrc: Oral Oral    Weight:      Height:        Dilation: 4.5 Effacement (%): 70 Cervical Position: Posterior Station: -2 Presentation: Vertex Exam by:: S Moyer RN FHT: baseline rate 135, moderate varibility, +acel, no decel Toco: irregular   Labs: Lab Results  Component Value Date   WBC 14.1 (H) 09/02/2019   HGB 12.9 09/02/2019   HCT 37.2 09/02/2019   MCV 97.4 09/02/2019   PLT 160 09/02/2019    Patient Active Problem List   Diagnosis Date Noted  . Rh negative state in antepartum period 09/02/2019  . Post-dates pregnancy 09/02/2019  . Rubella non-immune status, antepartum 06/18/2019  . Supervision of normal first pregnancy 02/17/2019  . 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 Dysmenorrhea 02/10/2016  . Patellar dislocation 03/04/2014   Assessment / Plan: 18 y.o. G1P0 at [redacted]w[redacted]d here for IOL 2/2 to post dates, s/p SROM.  Labor: Slow latent labor. S/p Cytotec x4, was on Pitocin for about 12 hours yesterday. Now SROM this morning and starting to get uncomfortable. Will restart Pitocin. Anticipate SVD. Fetal Wellbeing:  Category I  Pain Control:  Per patient request GBS: negative  Anticipated MOD:  Vaginal delivery    Chelsea Fair, MD OB Family Medicine Fellow, Faculty Practice [facility], [facility] 09/03/2019, 9:40 AM   

## 2019-09-03 NOTE — Progress Notes (Signed)
LABOR PROGRESS NOTE  Donna Huang is a 19 y.o. G1P0 at [redacted]w[redacted]d  admitted for IOL 2/2 to post dates.  Subjective: Patient reports that her water broke moments before my entering the room. No report of pain. SROM  Objective: BP 114/65   Pulse (!) 111   Temp 98.2 F (36.8 C) (Oral)   Resp 18   Ht 5\' 3"  (1.6 m)   Wt 87.8 kg   LMP 12/08/2018   BMI 34.28 kg/m  or  Vitals:   09/03/19 0513 09/03/19 0614 09/03/19 0616 09/03/19 0753  BP: (!) 94/54 (!) 91/47  114/65  Pulse: 91 (!) 101  (!) 111  Resp:  18  18  Temp:   98.5 F (36.9 C) 98.2 F (36.8 C)  TempSrc:   Oral Oral  Weight:      Height:       General: alert, sitting up in bed, in NAD   Dilation: 4.5 Effacement (%): 70 Cervical Position: Posterior Station: -2 Presentation: Vertex Exam by:: [email or web address] RN FHT: baseline rate 140, moderate varibility, +acel, no decel Toco: every 2 mins   Labs: Lab Results  Component Value Date   WBC 14.1 (H) 09/02/2019   HGB 12.9 09/02/2019   HCT 37.2 09/02/2019   MCV 97.4 09/02/2019   PLT 160 09/02/2019    Patient Active Problem List   Diagnosis Date Noted  . Rh negative state in antepartum period 09/02/2019  . Post-dates pregnancy 09/02/2019  . Rubella non-immune status, antepartum 06/18/2019  . Supervision of normal first pregnancy 02/17/2019  . 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 Dysmenorrhea 02/10/2016  . Patellar dislocation 03/04/2014   Assessment / Plan: 19 y.o. G1P0 at [redacted]w[redacted]d here for IOL 2/2 to post dates, s/p SROM.  Labor: starting pitocin  Fetal Wellbeing:  Category I  Pain Control:  Labor support without medications  GBS: negative  Anticipated MOD:  Vaginal delivery    [redacted]w[redacted]d, MD Family Medicine, PGY-1  09/03/2019, 8:15 AM

## 2019-09-03 NOTE — Anesthesia Preprocedure Evaluation (Signed)
Anesthesia Evaluation  Patient identified by MRN, date of birth, ID band Patient awake    Reviewed: Allergy & Precautions, Patient's Chart, lab work & pertinent test results  Airway Mallampati: II  TM Distance: >3 FB Neck ROM: Full    Dental no notable dental hx. (+) Teeth Intact, Dental Advisory Given   Pulmonary neg pulmonary ROS,    Pulmonary exam normal breath sounds clear to auscultation       Cardiovascular negative cardio ROS Normal cardiovascular exam Rhythm:Regular Rate:Normal     Neuro/Psych negative neurological ROS  negative psych ROS   GI/Hepatic negative GI ROS, Neg liver ROS,   Endo/Other  Obesity BMI 34  Renal/GU negative Renal ROS  negative genitourinary   Musculoskeletal negative musculoskeletal ROS (+)   Abdominal (+) + obese,   Peds negative pediatric ROS (+)  Hematology negative hematology ROS (+) hct 37.2, plt 160   Anesthesia Other Findings   Reproductive/Obstetrics (+) Pregnancy                             Anesthesia Physical Anesthesia Plan  ASA: II and emergent  Anesthesia Plan: Epidural   Post-op Pain Management:    Induction:   PONV Risk Score and Plan: 2  Airway Management Planned: Natural Airway  Additional Equipment: None  Intra-op Plan:   Post-operative Plan:   Informed Consent: I have reviewed the patients History and Physical, chart, labs and discussed the procedure including the risks, benefits and alternatives for the proposed anesthesia with the patient or authorized representative who has indicated his/her understanding and acceptance.       Plan Discussed with:   Anesthesia Plan Comments:         Anesthesia Quick Evaluation

## 2019-09-03 NOTE — Progress Notes (Signed)
LABOR PROGRESS NOTE  Donna Huang is a 19 y.o. G1P0 at [redacted]w[redacted]d  admitted for IOL 2/2 to post dates.  Subjective: Patient reports feeling comfortable.   Objective: BP (!) 92/50 Comment (BP Location): patient is laying on her left side  Pulse 88   Temp 98.8 F (37.1 C) (Oral)   Resp 18   Ht 5\' 3"  (1.6 m)   Wt 87.8 kg   LMP 12/08/2018   BMI 34.28 kg/m  or  Vitals:   09/03/19 0140 09/03/19 0206 09/03/19 0247 09/03/19 0251  BP: (!) 98/54   (!) 92/50  Pulse: 99   88  Resp: 18     Temp:  98.6 F (37 C) 98.8 F (37.1 C)   TempSrc:   Oral   Weight:      Height:       General: alert and in NAD, lying in bed supine   Dilation: 4 Effacement (%): 70 Cervical Position: Posterior Station: -2 Presentation: Vertex Exam by:: [email or web address], MD and Sharol Harness, RN FHT: baseline rate 140, minimal varibility, no acel, no decel Toco: irregular   Labs: Lab Results  Component Value Date   WBC 14.1 (H) 09/02/2019   HGB 12.9 09/02/2019   HCT 37.2 09/02/2019   MCV 97.4 09/02/2019   PLT 160 09/02/2019    Patient Active Problem List   Diagnosis Date Noted  . Rh negative state in antepartum period 09/02/2019  . Post-dates pregnancy 09/02/2019  . Rubella non-immune status, antepartum 06/18/2019  . Supervision of normal first pregnancy 02/17/2019  . 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 Dysmenorrhea 02/10/2016  . Patellar dislocation 03/04/2014    Assessment / Plan: 19 y.o. G1P0 at [redacted]w[redacted]d here for IOL due to post dates.  Labor: cytotec q4 hours, dose #4  Fetal Wellbeing:  catgory II, intermittent increase in baseline, intermittent fetal monitoring  Pain Control:  Labor support without medications for now  GBS: negative  Anticipated MOD:  Vaginal delivery    [redacted]w[redacted]d, MD Family Medicine, PGY-1  09/03/2019, 3:54 AM

## 2019-09-03 NOTE — Discharge Summary (Signed)
Postpartum Discharge Summary    Patient Name: Donna Huang DOB: [birth year] MRN: [id number]  Date of admission: 09/02/2019 Delivery date:09/03/2019  Delivering provider: Lenna Sciara  Date of discharge: 09/05/2019  Admitting diagnosis: Post-dates pregnancy [O48.0] Intrauterine pregnancy: [redacted]w[redacted]d    Secondary diagnosis:  Active Problems:   Supervision of normal first pregnancy   Rubella non-immune status, antepartum   Rh negative state in antepartum period   Post-dates pregnancy  Additional problems: None    Discharge diagnosis: Term Pregnancy Delivered                                              Post partum procedures:None Augmentation: Pitocin and Cytotec Complications: None  Hospital course: Induction of Labor With Vaginal Delivery   19y.o. yo G1P0 at 471w3das admitted to the hospital 09/02/2019 for induction of labor.  Indication for induction: Elective.  Patient had an uncomplicated labor course as follows: Initial SVE 2.5/70/-2. Patient received Cytotec, Pitocin. SROM occurred. Received epidural. She then progressed to complete.  Membrane Rupture Time/Date: 7:48 AM ,09/03/2019   Delivery Method:Vaginal, Spontaneous  Episiotomy: None  Lacerations:  Labial  Details of delivery can be found in separate delivery note.  Patient had a routine postpartum course. Patient is discharged home 09/05/19.  Newborn Data: Birth date:09/03/2019  Birth time:1:34 PM  Gender:Female  Living status:Living  Apgars:7 ,9  Weight:3240 g   Magnesium Sulfate received: No BMZ received: No Rhophylac:Yes MMR:No-declined T-DaP:Given prenatally Flu: No Transfusion:No  Physical exam  Vitals:   09/04/19 0535 09/04/19 1504 09/04/19 2107 09/05/19 0647  BP: 109/69 101/60 108/67 98/70  Pulse: 81 78 75 77  Resp: _0 Temp: (!) 97.5 F (36.4 C) 98.2 F (36.8 C) 98.1 F (36.7 C) 97.7 F (36.5 C)  TempSrc: Axillary Oral Oral Oral  SpO2:  100%    Weight:      Height:       General: alert,  cooperative and no distress Lochia: appropriate Uterine Fundus: firm Incision: N/A DVT Evaluation: No evidence of DVT seen on physical exam. No significant calf/ankle edema. Labs: Lab Results  Component Value Date   WBC 14.1 (H) 09/02/2019   HGB 12.9 09/02/2019   HCT 37.2 09/02/2019   MCV 97.4 09/02/2019   PLT 160 09/02/2019   No flowsheet data found. Edinburgh Score: Edinburgh Postnatal Depression Scale Screening Tool 09/03/2019  I have been able to laugh and see the funny side of things. (No Data)     After visit meds:  Allergies as of 09/05/2019   No Known Allergies     Medication List    TAKE these medications   acetaminophen 325 MG tablet Commonly known as: Tylenol Take 2 tablets (650 mg total) by mouth every 6 (six) hours as needed (for pain scale < 4).   ibuprofen 600 MG tablet Commonly known as: ADVIL Take 1 tablet (600 mg total) by mouth every 8 (eight) hours as needed for mild pain.   polyethylene glycol powder 17 GM/SCOOP powder Commonly known as: GLYCOLAX/MIRALAX Take 17 g by mouth daily as needed.   PRENATAL VITAMINS PO Take 1 capsule by mouth daily.        Discharge home in stable condition Infant Feeding: Breast Infant Disposition:home with mother Discharge instruction: per After Visit Summary and Postpartum booklet. Activity: Advance as tolerated. Pelvic rest for 6 weeks.  Diet: routine diet Future Appointments:No future appointments. Follow up Visit:   Please schedule this patient for a Virtual postpartum visit in 4 weeks with the following provider: Any provider. Additional Postpartum F/U:None  Low risk pregnancy complicated by: none Delivery mode:  Vaginal, Spontaneous  Anticipated Birth Control:  Unsure   09/05/2019 Matthew M Eckstat, MD   

## 2019-09-03 NOTE — Anesthesia Procedure Notes (Signed)
Epidural Patient location during procedure: OB Start time: 09/03/2019 10:30 AM End time: 09/03/2019 10:39 AM  Staffing Anesthesiologist: Lannie Fields, DO Performed: anesthesiologist   Preanesthetic Checklist Completed: patient identified, IV checked, risks and benefits discussed, monitors and equipment checked, pre-op evaluation and timeout performed  Epidural Patient position: sitting Prep: DuraPrep and site prepped and draped Patient monitoring: continuous pulse ox, blood pressure, heart rate and cardiac monitor Approach: midline Location: L3-L4 Injection technique: LOR air  Needle:  Needle type: Tuohy  Needle gauge: 17 G Needle length: 9 cm Needle insertion depth: 6 cm Catheter type: closed end flexible Catheter size: 19 Gauge Catheter at skin depth: 11 cm Test dose: negative  Assessment Sensory level: T8 Events: blood not aspirated, injection not painful, no injection resistance, no paresthesia and negative IV test  Additional Notes Patient identified. Risks/Benefits/Options discussed with patient including but not limited to bleeding, infection, nerve damage, paralysis, failed block, incomplete pain control, headache, blood pressure changes, nausea, vomiting, reactions to medication both or allergic, itching and postpartum back pain. Confirmed with bedside nurse the patient's most recent platelet count. Confirmed with patient that they are not currently taking any anticoagulation, have any bleeding history or any family history of bleeding disorders. Patient expressed understanding and wished to proceed. All questions were answered. Sterile technique was used throughout the entire procedure. Please see nursing notes for vital signs. Test dose was given through epidural catheter and negative prior to continuing to dose epidural or start infusion. Warning signs of high block given to the patient including shortness of breath, tingling/numbness in hands, complete motor  block, or any concerning symptoms with instructions to call for help. Patient was given instructions on fall risk and not to get out of bed. All questions and concerns addressed with instructions to call with any issues or inadequate analgesia.  Reason for block:procedure for pain

## 2019-09-03 NOTE — Progress Notes (Signed)
Patient ID: Donna Huang, female   DOB: Nov 24, 2000, 19 y.o.   MRN: [id number]   Donna Huang is a 19 y.o. G1P0 at [redacted]w[redacted]d admitted for induction of labor 2/2 post dates.  Subjective: Comfortable  Objective: BP (!) 105/55   Pulse (!) 105   Temp 98.7 F (37.1 C) (Oral)   Resp 18   Ht 5\' 3"  (1.6 m)   Wt 87.8 kg   LMP 12/08/2018   BMI 34.28 kg/m  No intake/output data recorded.  FHT:  FHR: 135 bpm, variability: moderate,  accelerations:  Present,  decelerations:  Absent UC:   regular, every 3-4 minutes  SVE:   Dilation: 4 Effacement (%): 70 Station: -2 Exam by:: [email or web address], MD  Labs: Lab Results  Component Value Date   WBC 14.1 (H) 09/02/2019   HGB 12.9 09/02/2019   HCT 37.2 09/02/2019   MCV 97.4 09/02/2019   PLT 160 09/02/2019    Assessment / Plan: Induction of labor due to postterm   Labor: Cytotec #3 placed as cervix still firm and posterior, Bishops score 5. Long discussion held with patient and family members regarding expectations of time course and likely next steps, all questions answered.  Fetal Wellbeing:  Category I Pain Control:  Labor support without medications I/D:  GBS negative Anticipated MOD:   Vaginal  11/02/2019, MD OB Fellow 09/03/2019, 12:26 AM

## 2019-09-04 MED ORDER — RHO D IMMUNE GLOBULIN 1500 UNIT/2ML IJ SOSY
300.0000 ug | PREFILLED_SYRINGE | Freq: Once | INTRAMUSCULAR | Status: AC
  Administered 2019-09-04: 300 ug via INTRAVENOUS
  Filled 2019-09-04: qty 2

## 2019-09-04 NOTE — Lactation Note (Signed)
This note was copied from a baby's chart. Lactation Consultation Note  Patient Name: Donna Huang NIDPO'E Date: 09/04/2019 Reason for consult: Initial assessment;1st time breastfeeding;Term P1, 14 hour term female infant. Mom 's hx: ADHD Infant had 2 voids and 4 stools since birth. Per mom, infant has not been latching well 1 minutes in L&D and 2 minutes in less in room.  Per parent, they offered formula in bottle, mom did not think she had enough milk for infant. LC discussed tummy size and LC discussed hand expression, mom taught back hand expression and infant was given 7 mls oc colostrum by spoon. LC discussed with parents small tummy size. LC asked mom hand express small amount of colostrum out prior to latching infant at breast, mom is short shafted, infant latched to left breast and breastfed for 7 minutes. Parents were pleased with this feeding due this being the longest infant has breastfeed.  LC discussed with mom infant is learning how to breastfeed, continue to latch infant at breast and if she needs assistance to ask RN or LC. Mom was given a hand pump to pre-pump breast prior to latching infant, mom easily pumped 7 mls in demonstration  of how to use hand pump.  Reviewed Baby & Me book's Breastfeeding Basics.  Mom made aware of O/P services, breastfeeding support groups, community resources, and our phone # for post-discharge questions.  Maternal Data Formula Feeding for Exclusion: No Has patient been taught Hand Expression?: Yes Does the patient have breastfeeding experience prior to this delivery?: No  Feeding Feeding Type: Breast Fed  LATCH Score Latch: Repeated attempts needed to sustain latch, nipple held in mouth throughout feeding, stimulation needed to elicit sucking reflex.  Audible Swallowing: Spontaneous and intermittent  Type of Nipple: Everted at rest and after stimulation  Comfort (Breast/Nipple): Soft / non-tender  Hold (Positioning): Assistance  needed to correctly position infant at breast and maintain latch.  LATCH Score: 8  Interventions Interventions: Breast feeding basics reviewed;Breast compression;Adjust position;Assisted with latch;Skin to skin;Support pillows;Hand pump;Breast massage;Position options;Hand express;Expressed milk;Pre-pump if needed  Lactation Tools Discussed/Used Tools: Pump WIC Program: Yes Pump Review: Setup, frequency, and cleaning;Milk Storage Initiated by:: Danelle Earthly, IBCLC Date initiated:: 09/04/19   Consult Status Consult Status: Follow-up Date: 09/04/19 Follow-up type: In-patient    Danelle Earthly 09/04/2019, 4:07 AM

## 2019-09-04 NOTE — Anesthesia Postprocedure Evaluation (Signed)
Anesthesia Post Note  Patient: Donna Huang  Procedure(s) Performed: AN AD HOC LABOR EPIDURAL     Patient location during evaluation: Mother Baby Anesthesia Type: Epidural Level of consciousness: awake and alert Pain management: pain level controlled Vital Signs Assessment: post-procedure vital signs reviewed and stable Respiratory status: spontaneous breathing, nonlabored ventilation and respiratory function stable Cardiovascular status: stable Postop Assessment: no headache, no backache and epidural receding Anesthetic complications: no    Last Vitals:  Vitals:   09/04/19 0107 09/04/19 0535  BP: 102/62 109/69  Pulse: 67 81  Resp: 16 16  Temp: 36.7 C (!) 36.4 C  SpO2:      Last Pain:  Vitals:   09/04/19 0535  TempSrc: Axillary  PainSc: 3    Pain Goal: Patients Stated Pain Goal: 4 (09/03/19 1015)                 Minerva Areola

## 2019-09-04 NOTE — Progress Notes (Signed)
Post Partum Day 1 Subjective: no complaints, up ad lib, voiding and tolerating PO, small lochia, plans to breastfeed, unsure at this time.   Objective: Blood pressure 109/69, pulse 81, temperature (!) 97.5 F (36.4 C), temperature source Axillary, resp. rate 16, height 5\' 3"  (1.6 m), weight 87.8 kg, last menstrual period 12/08/2018, SpO2 100 %, unknown if currently breastfeeding.  Physical Exam:  General: alert, cooperative and no distress Lochia:normal flow Chest: CTAB Heart: RRR no m/r/g Abdomen: +BS, soft, nontender,  Uterine Fundus: firm DVT Evaluation: No evidence of DVT seen on physical exam. Extremities: no edema  Recent Labs    09/02/19 0028  HGB 12.9  HCT 37.2    Assessment/Plan: Plan for discharge tomorrow   LOS: 2 days   11/02/19 09/04/2019, 7:40 AM

## 2019-09-05 LAB — RH IG WORKUP (INCLUDES ABO/RH)
ABO/RH(D): O NEG
Fetal Screen: NEGATIVE
Gestational Age(Wks): 40.3
Unit division: 0

## 2019-09-05 MED ORDER — ACETAMINOPHEN 325 MG PO TABS: 650.0000 mg | ORAL_TABLET | Freq: Four times a day (QID) | ORAL | 0 refills | Status: DC | PRN

## 2019-09-05 MED ORDER — IBUPROFEN 600 MG PO TABS: 600.0000 mg | ORAL_TABLET | Freq: Three times a day (TID) | ORAL | 0 refills | Status: DC | PRN

## 2019-09-05 MED ORDER — POLYETHYLENE GLYCOL 3350 17 GM/SCOOP PO POWD
17.0000 g | Freq: Every day | ORAL | 1 refills | Status: DC | PRN
Start: 2019-09-05 — End: 2020-08-26

## 2019-09-05 NOTE — Discharge Instructions (Signed)
Postpartum Care After Vaginal Delivery This sheet gives you information about how to care for yourself from the time you deliver your baby to up to 6-12 weeks after delivery (postpartum period). Your health care provider may also give you more specific instructions. If you have problems or questions, contact your health care provider. Follow these instructions at home: Vaginal bleeding  It is normal to have vaginal bleeding (lochia) after delivery. Wear a sanitary pad for vaginal bleeding and discharge. ? During the first week after delivery, the amount and appearance of lochia is often similar to a menstrual period. ? Over the next few weeks, it will gradually decrease to a dry, yellow-brown discharge. ? For most women, lochia stops completely by 4-6 weeks after delivery. Vaginal bleeding can vary from woman to woman.  Change your sanitary pads frequently. Watch for any changes in your flow, such as: ? A sudden increase in volume. ? A change in color. ? Large blood clots.  If you pass a blood clot from your vagina, save it and call your health care provider to discuss. Do not flush blood clots down the toilet before talking with your health care provider.  Do not use tampons or douches until your health care provider says this is safe.  If you are not breastfeeding, your period should return 6-8 weeks after delivery. If you are feeding your child breast milk only (exclusive breastfeeding), your period may not return until you stop breastfeeding. Perineal care  Keep the area between the vagina and the anus (perineum) clean and dry as told by your health care provider. Use medicated pads and pain-relieving sprays and creams as directed.  If you had a cut in the perineum (episiotomy) or a tear in the vagina, check the area for signs of infection until you are healed. Check for: ? More redness, swelling, or pain. ? Fluid or blood coming from the cut or tear. ? Warmth. ? Pus or a bad  smell.  You may be given a squirt bottle to use instead of wiping to clean the perineum area after you go to the bathroom. As you start healing, you may use the squirt bottle before wiping yourself. Make sure to wipe gently.  To relieve pain caused by an episiotomy, a tear in the vagina, or swollen veins in the anus (hemorrhoids), try taking a warm sitz bath 2-3 times a day. A sitz bath is a warm water bath that is taken while you are sitting down. The water should only come up to your hips and should cover your buttocks. Breast care  Within the first few days after delivery, your breasts may feel heavy, full, and uncomfortable (breast engorgement). Milk may also leak from your breasts. Your health care provider can suggest ways to help relieve the discomfort. Breast engorgement should go away within a few days.  If you are breastfeeding: ? Wear a bra that supports your breasts and fits you well. ? Keep your nipples clean and dry. Apply creams and ointments as told by your health care provider. ? You may need to use breast pads to absorb milk that leaks from your breasts. ? You may have uterine contractions every time you breastfeed for up to several weeks after delivery. Uterine contractions help your uterus return to its normal size. ? If you have any problems with breastfeeding, work with your health care provider or lactation consultant.  If you are not breastfeeding: ? Avoid touching your breasts a lot. Doing this can make   your breasts produce more milk. ? Wear a good-fitting bra and use cold packs to help with swelling. ? Do not squeeze out (express) milk. This causes you to make more milk. Intimacy and sexuality  Ask your health care provider when you can engage in sexual activity. This may depend on: ? Your risk of infection. ? How fast you are healing. ? Your comfort and desire to engage in sexual activity.  You are able to get pregnant after delivery, even if you have not had  your period. If desired, talk with your health care provider about methods of birth control (contraception). Medicines  Take over-the-counter and prescription medicines only as told by your health care provider.  If you were prescribed an antibiotic medicine, take it as told by your health care provider. Do not stop taking the antibiotic even if you start to feel better. Activity  Gradually return to your normal activities as told by your health care provider. Ask your health care provider what activities are safe for you.  Rest as much as possible. Try to rest or take a nap while your baby is sleeping. Eating and drinking   Drink enough fluid to keep your urine pale yellow.  Eat high-fiber foods every day. These may help prevent or relieve constipation. High-fiber foods include: ? Whole grain cereals and breads. ? Brown rice. ? Beans. ? Fresh fruits and vegetables.  Do not try to lose weight quickly by cutting back on calories.  Take your prenatal vitamins until your postpartum checkup or until your health care provider tells you it is okay to stop. Lifestyle  Do not use any products that contain nicotine or tobacco, such as cigarettes and e-cigarettes. If you need help quitting, ask your health care provider.  Do not drink alcohol, especially if you are breastfeeding. General instructions  Keep all follow-up visits for you and your baby as told by your health care provider. Most women visit their health care provider for a postpartum checkup within the first 3-6 weeks after delivery. Contact a health care provider if:  You feel unable to cope with the changes that your child brings to your life, and these feelings do not go away.  You feel unusually sad or worried.  Your breasts become red, painful, or hard.  You have a fever.  You have trouble holding urine or keeping urine from leaking.  You have little or no interest in activities you used to enjoy.  You have not  breastfed at all and you have not had a menstrual period for 12 weeks after delivery.  You have stopped breastfeeding and you have not had a menstrual period for 12 weeks after you stopped breastfeeding.  You have questions about caring for yourself or your baby.  You pass a blood clot from your vagina. Get help right away if:  You have chest pain.  You have difficulty breathing.  You have sudden, severe leg pain.  You have severe pain or cramping in your lower abdomen.  You bleed from your vagina so much that you fill more than one sanitary pad in one hour. Bleeding should not be heavier than your heaviest period.  You develop a severe headache.  You faint.  You have blurred vision or spots in your vision.  You have bad-smelling vaginal discharge.  You have thoughts about hurting yourself or your baby. If you ever feel like you may hurt yourself or others, or have thoughts about taking your own life, get help  right away. You can go to the nearest emergency department or call:  Your local emergency services (911 in the [place]  A suicide crisis helpline, such as the National Suicide Prevention Lifeline at 1-800-273-8255. This is open 24 hours a day. Summary  The period of time right after you deliver your newborn up to 6-12 weeks after delivery is called the postpartum period.  Gradually return to your normal activities as told by your health care provider.  Keep all follow-up visits for you and your baby as told by your health care provider. This information is not intended to replace advice given to you by your health care provider. Make sure you discuss any questions you have with your health care provider. Document Revised: 03/22/2017 Document Reviewed: 12/31/2016 Elsevier Patient Education  2020 Elsevier Inc.    Contraception Choices Contraception, also called birth control, means things to use or ways to try not to get pregnant. Hormonal birth control This kind  of birth control uses hormones. Here are some types of hormonal birth control:  A tube that is put under skin of the arm (implant). The tube can stay in for as long as 3 years.  Shots to get every 3 months (injections).  Pills to take every day (birth control pills).  A patch to change 1 time each week for 3 weeks (birth control patch). After that, the patch is taken off for 1 week.  A ring to put in the vagina. The ring is left in for 3 weeks. Then it is taken out of the vagina for 1 week. Then a new ring is put in.  Pills to take after unprotected sex (emergency birth control pills). Barrier birth control Here are some types of barrier birth control:  A thin covering that is put on the penis before sex (female condom). The covering is thrown away after sex.  A soft, loose covering that is put in the vagina before sex (female condom). The covering is thrown away after sex.  A rubber bowl that sits over the cervix (diaphragm). The bowl must be made for you. The bowl is put into the vagina before sex. The bowl is left in for 6-8 hours after sex. It is taken out within 24 hours.  A small, soft cup that fits over the cervix (cervical cap). The cup must be made for you. The cup can be left in for 6-8 hours after sex. It is taken out within 48 hours.  A sponge that is put into the vagina before sex. It must be left in for at least 6 hours after sex. It must be taken out within 30 hours. Then it is thrown away.  A chemical that kills or stops sperm from getting into the uterus (spermicide). It may be a pill, cream, jelly, or foam to put in the vagina. The chemical should be used at least 10-15 minutes before sex. IUD (intrauterine) birth control An IUD is a small, T-shaped piece of plastic. It is put inside the uterus. There are two kinds:  Hormone IUD. This kind can stay in for 3-5 years.  Copper IUD. This kind can stay in for 10 years. Permanent birth control Here are some types of  permanent birth control:  Surgery to block the fallopian tubes.  Having an insert put into each fallopian tube.  Surgery to tie off the tubes that carry sperm (vasectomy). Natural planning birth control Here are some types of natural planning birth control:  Not having sex on   the days the woman could get pregnant.  Using a calendar: ? To keep track of the length of each period. ? To find out what days pregnancy can happen. ? To plan to not have sex on days when pregnancy can happen.  Watching for symptoms of ovulation and not having sex during ovulation. One way the woman can check for ovulation is to check her temperature.  Waiting to have sex until after ovulation. Summary  Contraception, also called birth control, means things to use or ways to try not to get pregnant.  Hormonal methods of birth control include implants, injections, pills, patches, vaginal rings, and emergency birth control pills.  Barrier methods of birth control can include female condoms, female condoms, diaphragms, cervical caps, sponges, and spermicides.  There are two types of IUD (intrauterine device) birth control. An IUD can be put in a woman's uterus to prevent pregnancy for 3-5 years.  Permanent sterilization can be done through a procedure for males, females, or both.  Natural planning methods involve not having sex on the days when the woman could get pregnant. This information is not intended to replace advice given to you by your health care provider. Make sure you discuss any questions you have with your health care provider. Document Revised: 07/09/2018 Document Reviewed: 03/29/2016 Elsevier Patient Education  2020 [employer].

## 2019-09-05 NOTE — Plan of Care (Signed)
  Problem: Education: Goal: Knowledge of General Education information will improve Description: Including pain rating scale, medication(s)/side effects and non-pharmacologic comfort measures Outcome: Completed/Met   Problem: Health Behavior/Discharge Planning: Goal: Ability to manage health-related needs will improve Outcome: Completed/Met   Problem: Clinical Measurements: Goal: Ability to maintain clinical measurements within normal limits will improve Outcome: Completed/Met Goal: Will remain free from infection Outcome: Completed/Met Goal: Diagnostic test results will improve Outcome: Completed/Met Goal: Respiratory complications will improve Outcome: Completed/Met Goal: Cardiovascular complication will be avoided Outcome: Completed/Met   Problem: Activity: Goal: Risk for activity intolerance will decrease Outcome: Completed/Met   Problem: Nutrition: Goal: Adequate nutrition will be maintained Outcome: Completed/Met   Problem: Coping: Goal: Level of anxiety will decrease Outcome: Completed/Met   Problem: Elimination: Goal: Will not experience complications related to bowel motility Outcome: Completed/Met Goal: Will not experience complications related to urinary retention Outcome: Completed/Met   Problem: Pain Managment: Goal: General experience of comfort will improve Outcome: Completed/Met   Problem: Safety: Goal: Ability to remain free from injury will improve Outcome: Completed/Met   Problem: Education: Goal: Knowledge of condition will improve Outcome: Completed/Met Goal: Individualized Educational Video(s) Outcome: Completed/Met Goal: Individualized Newborn Educational Video(s) Outcome: Completed/Met   Problem: Activity: Goal: Will verbalize the importance of balancing activity with adequate rest periods Outcome: Completed/Met Goal: Ability to tolerate increased activity will improve Outcome: Completed/Met   Problem: Coping: Goal: Ability to  identify and utilize available resources and services will improve Outcome: Completed/Met   Problem: Life Cycle: Goal: Chance of risk for complications during the postpartum period will decrease Outcome: Completed/Met   Problem: Role Relationship: Goal: Ability to demonstrate positive interaction with newborn will improve Outcome: Completed/Met   Problem: Skin Integrity: Goal: Demonstration of wound healing without infection will improve Outcome: Completed/Met

## 2019-09-05 NOTE — Lactation Note (Signed)
This note was copied from a baby's chart. Lactation Consultation Note RN informed LC that mom stated she wants to bottle feed now. LC attempted to see mom, mom sleeping.  Patient Name: Donna Huang [id number] Date: 09/05/2019     Maternal Data    Feeding Feeding Type: Bottle Fed - Formula Nipple Type: Nfant Slow Flow (purple)  LATCH Score Latch: (mom wanted to bottle feed)                 Interventions    Lactation Tools Discussed/Used     Consult Status      Wofford Stratton G 09/05/2019, 12:39 AM

## 2019-10-01 ENCOUNTER — Encounter: Payer: Self-pay | Admitting: Obstetrics and Gynecology

## 2019-10-01 ENCOUNTER — Telehealth (INDEPENDENT_AMBULATORY_CARE_PROVIDER_SITE_OTHER): Payer: Managed Care, Other (non HMO) | Admitting: Obstetrics and Gynecology

## 2019-10-01 NOTE — Progress Notes (Signed)
Obstetrics/Postpartum Visit  Appointment Date: 10/01/2019  [facility]  Primary Care Provider: Loleta Dicker  Chief Complaint:  Chief Complaint  Patient presents with  . Postpartum Care    History of Present Illness: Donna Huang is a 19 y.o. Caucasian G1P1001 (No LMP recorded.), seen for the above chief complaint. Her past medical history is significant for n/a.   She is s/p SVD on 09/03/19 at 41 weeks; she was discharged to home on PPD#2. Pregnancy complicated by Rh negative.  Complains of nothing. Doing well.   Vaginal bleeding or discharge: No  Breast or formula feeding:  bottle Intercourse: No  Contraception: declines PP depression s/s: No  Any bowel or bladder issues: No  Pap smear: n/a   Review of Systems: Positive for n/a.   Her 12 point review of systems is negative or as noted in the History of Present Illness.  Patient Active Problem List   Diagnosis Date Noted  . Rh negative state in antepartum period 09/02/2019  . Post-dates pregnancy 09/02/2019  . Rubella non-immune status, antepartum 06/18/2019  . Supervision of normal first pregnancy 02/17/2019  . 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 Dysmenorrhea 02/10/2016  . Patellar dislocation 03/04/2014    Medications Luella Gardenhire. Morandi had no medications administered during this visit. Current Outpatient Medications  Medication Sig Dispense Refill  . Prenatal Vit-Fe Fumarate-FA (PRENATAL VITAMINS PO) Take 1 capsule by mouth daily.    Marland Kitchen acetaminophen (TYLENOL) 325 MG tablet Take 2 tablets (650 mg total) by mouth every 6 (six) hours as needed (for pain scale < 4). (Patient not taking: Reported on 10/01/2019) 30 tablet 0  . ibuprofen (ADVIL) 600 MG tablet Take 1 tablet (600 mg total) by mouth every 8 (eight) hours as needed for mild pain. (Patient not taking: Reported on 10/01/2019) 30 tablet 0  . polyethylene glycol powder (GLYCOLAX/MIRALAX) 17 GM/SCOOP powder Take 17 g by  mouth daily as needed. (Patient not taking: Reported on 10/01/2019) 510 g 1   No current facility-administered medications for this visit.   Allergies Patient has no known allergies.  Physical Exam:  There were no vitals taken for this visit. There is no height or weight on file to calculate BMI. General appearance: Well nourished, well developed female in no acute distress.  Respiratory:  Normal respiratory effort  PP Depression Screening:    Edinburgh Postnatal Depression Scale - 10/01/19 1352      Edinburgh Postnatal Depression Scale:  In the Past 7 Days   I have been able to laugh and see the funny side of things. 0    I have looked forward with enjoyment to things. 0    I have blamed myself unnecessarily when things went wrong. 0    I have been anxious or worried for no good reason. 0    I have felt scared or panicky for no good reason. 0    Things have been getting on top of me. 0    I have been so unhappy that I have had difficulty sleeping. 0    I have felt sad or miserable. 0    I have been so unhappy that I have been crying. 0    The thought of harming myself has occurred to me. 0    Edinburgh Postnatal Depression Scale Total 0           Assessment: Patient is a 19 y.o. G1P1001 who is 4 weeks post partum from a SVD. She  is doing well.   Plan:   Essential components of care per ACOG recommendations:  1.  Mood and well being: Patient with negative depression screening today. Reviewed local resources for support.  - Patient does not use tobacco.  - hx of drug use? No   2. Infant care and feeding:  -Patient currently breastmilk feeding? No  -Social determinants of health (SDOH) reviewed in EPIC. No concerns  3. Sexuality, contraception and birth spacing - Patient does not want a pregnancy in the next year.  Desired family size is unknown children.  - Reviewed forms of contraception in tiered fashion. Patient desired no method today.   - Discussed birth spacing of  18 months  4. Sleep and fatigue -Encouraged family/partner/community support of 4 hrs of uninterrupted sleep to help with mood and fatigue  5. Physical Recovery  - Discussed patients delivery and complications - Patient had a minor labial laceration, perineal healing reviewed. Patient expressed understanding - Patient has urinary incontinence? No  - Patient is safe to resume physical and sexual activity  6.  Health Maintenance - not due for pap yet   RTC for annual or prn  15 minutes of non-face-to-face time spent with the patient    K. Therese Sarah, M.D. Attending Center for [employer] [facility])

## 2019-10-08 ENCOUNTER — Ambulatory Visit (INDEPENDENT_AMBULATORY_CARE_PROVIDER_SITE_OTHER): Payer: Managed Care, Other (non HMO) | Admitting: Obstetrics and Gynecology

## 2019-10-08 ENCOUNTER — Other Ambulatory Visit: Payer: Self-pay

## 2019-10-08 NOTE — Progress Notes (Signed)
° °  GYNECOLOGY OFFICE VISIT NOTE  History:  19 y.o. G1P1001 here today for pain in groin where she had laceration with delivery. Concerned it may be infected due to pain. Not really getting better yet, improves with tylenol/ibuprofen. She denies any abnormal vaginal discharge, bleeding, pelvic pain or other concerns.   Past Medical History:  Diagnosis Date   ADD (attention deficit disorder)    ADHD (attention deficit hyperactivity disorder)     Past Surgical History:  Procedure Laterality Date   KNEE SURGERY Left    TONSILLECTOMY       Current Outpatient Medications:    acetaminophen (TYLENOL) 325 MG tablet, Take 2 tablets (650 mg total) by mouth every 6 (six) hours as needed (for pain scale < 4)., Disp: 30 tablet, Rfl: 0   ibuprofen (ADVIL) 600 MG tablet, Take 1 tablet (600 mg total) by mouth every 8 (eight) hours as needed for mild pain., Disp: 30 tablet, Rfl: 0   polyethylene glycol powder (GLYCOLAX/MIRALAX) 17 GM/SCOOP powder, Take 17 g by mouth daily as needed. (Patient not taking: Reported on 10/01/2019), Disp: 510 g, Rfl: 1   Prenatal Vit-Fe Fumarate-FA (PRENATAL VITAMINS PO), Take 1 capsule by mouth daily. (Patient not taking: Reported on 10/08/2019), Disp: , Rfl:   The following portions of the patient's history were reviewed and updated as appropriate: allergies, current medications, past family history, past medical history, past social history, past surgical history and problem list.   Review of Systems:  Pertinent items noted in HPI and remainder of comprehensive ROS otherwise negative.   Objective:  Physical Exam BP 102/72    Pulse 81    Temp 98.2 F (36.8 C)    Ht 5\' 3"  (1.6 m)    Wt 170 lb (77.1 kg)    Breastfeeding No    BMI 30.11 kg/m  CONSTITUTIONAL: Well-developed, well-nourished female in no acute distress.  HENT:  Normocephalic, atraumatic. External right and left ear normal. Oropharynx is clear and moist EYES: Conjunctivae and EOM are normal. Pupils are  equal, round, and reactive to light. No scleral icterus.  NECK: Normal range of motion, supple, no masses SKIN: Skin is warm and dry. No rash noted. Not diaphoretic. No erythema. No pallor. NEUROLOGIC: Alert and oriented to person, place, and time. Normal reflexes, muscle tone coordination. No cranial nerve deficit noted. PSYCHIATRIC: Normal mood and affect. Normal behavior. Normal judgment and thought content. CARDIOVASCULAR: Normal heart rate noted RESPIRATORY: Effort normal, no problems with respiration noted ABDOMEN: Soft, no distention noted.   PELVIC: Normal appearing external genitalia; normal appearing introitus with stitches noted at left vaginal wall, removed easily with scissors, no areas of erythema or inflammation MUSCULOSKELETAL: Normal range of motion. No edema noted.  Exam done with chaperone present.  Labs and Imaging No results found.  Assessment & Plan:   1. Obstetric vaginal laceration without perineal laceration Appears to be healing well without infection Sutures removed F/u as needed  Routine preventative health maintenance measures emphasized. Please refer to After Visit Summary for other counseling recommendations.   No follow-ups on file.   Total face-to-face time with patient: 12 minutes. Over 50% of encounter was spent on counseling and coordination of care.   , M.D. Attending Center for [facility] [employer])

## 2020-04-02 NOTE — L&D Delivery Note (Signed)
OB/GYN Faculty Practice Delivery Note  Donna Huang is a 20 y.o. G3P1011 s/p VD at [redacted]w[redacted]d. She was admitted for eIOL.   ROM: 8h 79m with clear fluid GBS Status: Negative   Maximum Maternal Temperature: 98.37F  Labor Progress: Initial SVE: 3/70/-2. She then progressed to complete with assistance of pit and AROM.   Delivery Date/Time: 12/29, 0240 Delivery: Checked in on patient as she was feeling more pressure, found to be complete. Pushed for approximately 10 minutes. Head delivered direct OA. Nuchal cord present and reduced at the perineum. Shoulder and body delivered in usual fashion. Infant with spontaneous cry, placed on mother's abdomen, dried and stimulated. Cord clamped x 2 after 1-minute delay, and cut by FOB. Cord blood drawn. Placenta delivered spontaneously with gentle cord traction. Fundus firm with massage and Pitocin. Labia, perineum, vagina, and cervix inspected with no lacerations.  Baby Weight: pending  Placenta: 3 vessel, intact. Sent to L&D Complications: None Lacerations: None EBL: 150 mL Analgesia: Epidural   Infant:  APGAR (1 MIN): 8   APGAR (5 MINS): 9    [name], DO  OB Family Medicine Fellow, [facility] for [employer], [facility] 03/30/2021, 3:20 AM

## 2020-05-03 ENCOUNTER — Telehealth: Payer: Self-pay

## 2020-05-03 NOTE — Telephone Encounter (Signed)
Pt called stating she feels like she has a yeast infection and wants to know what she can take OTC. Pt was told to use 7 Day Monistat and if the Monistat does not help then she will need to come in for an appt. Pt expressed understanding.

## 2020-05-10 ENCOUNTER — Encounter (HOSPITAL_COMMUNITY): Payer: Self-pay | Admitting: Obstetrics and Gynecology

## 2020-05-10 ENCOUNTER — Other Ambulatory Visit: Payer: Self-pay

## 2020-05-10 ENCOUNTER — Telehealth: Payer: Self-pay

## 2020-05-10 ENCOUNTER — Inpatient Hospital Stay (HOSPITAL_COMMUNITY)
Admission: AD | Admit: 2020-05-10 | Discharge: 2020-05-11 | Disposition: A | Discharge status: Home / Self Care | Payer: Managed Care, Other (non HMO) | Attending: Obstetrics and Gynecology | Admitting: Obstetrics and Gynecology

## 2020-05-10 ENCOUNTER — Inpatient Hospital Stay (HOSPITAL_COMMUNITY): Payer: Managed Care, Other (non HMO)

## 2020-05-10 DIAGNOSIS — Z79899 Other long term (current) drug therapy: Secondary | ICD-10-CM | POA: Insufficient documentation

## 2020-05-10 DIAGNOSIS — O021 Missed abortion: Secondary | ICD-10-CM | POA: Diagnosis not present

## 2020-05-10 DIAGNOSIS — O209 Hemorrhage in early pregnancy, unspecified: Secondary | ICD-10-CM

## 2020-05-10 DIAGNOSIS — O3680X0 Pregnancy with inconclusive fetal viability, not applicable or unspecified: Secondary | ICD-10-CM

## 2020-05-10 DIAGNOSIS — Z3A08 8 weeks gestation of pregnancy: Secondary | ICD-10-CM | POA: Insufficient documentation

## 2020-05-10 LAB — WET PREP, GENITAL
Clue Cells Wet Prep HPF POC: NONE SEEN
Sperm: NONE SEEN
Trich, Wet Prep: NONE SEEN
Yeast Wet Prep HPF POC: NONE SEEN

## 2020-05-10 LAB — CBC
HCT: 43.2 % (ref 36.0–46.0)
Hemoglobin: 14.4 g/dL (ref 12.0–15.0)
MCH: 31.9 pg (ref 26.0–34.0)
MCHC: 33.3 g/dL (ref 30.0–36.0)
MCV: 95.6 fL (ref 80.0–100.0)
Platelets: 202 10*3/uL (ref 150–400)
RBC: 4.52 MIL/uL (ref 3.87–5.11)
RDW: 11.7 % (ref 11.5–15.5)
WBC: 7.8 10*3/uL (ref 4.0–10.5)
nRBC: 0 % (ref 0.0–0.2)

## 2020-05-10 LAB — POCT PREGNANCY, URINE: Preg Test, Ur: POSITIVE — AB

## 2020-05-10 LAB — URINALYSIS, ROUTINE W REFLEX MICROSCOPIC
Bacteria, UA: NONE SEEN
Bilirubin Urine: NEGATIVE
Glucose, UA: NEGATIVE mg/dL
Ketones, ur: NEGATIVE mg/dL
Leukocytes,Ua: NEGATIVE
Nitrite: NEGATIVE
Protein, ur: NEGATIVE mg/dL
Specific Gravity, Urine: 1.005 (ref 1.005–1.030)
pH: 7 (ref 5.0–8.0)

## 2020-05-10 LAB — HIV ANTIBODY (ROUTINE TESTING W REFLEX): HIV Screen 4th Generation wRfx: NONREACTIVE

## 2020-05-10 LAB — HCG, QUANTITATIVE, PREGNANCY: hCG, Beta Chain, Quant, S: 8229 m[IU]/mL — ABNORMAL HIGH (ref <5)

## 2020-05-10 MED ORDER — PROMETHAZINE HCL 25 MG PO TABS: 25.0000 mg | ORAL_TABLET | Freq: Four times a day (QID) | ORAL | 0 refills | Status: DC | PRN

## 2020-05-10 MED ORDER — RHO D IMMUNE GLOBULIN 1500 UNIT/2ML IJ SOSY
300.0000 ug | PREFILLED_SYRINGE | Freq: Once | INTRAMUSCULAR | Status: AC
  Administered 2020-05-10: 300 ug via INTRAMUSCULAR
  Filled 2020-05-10: qty 2

## 2020-05-10 MED ORDER — MISOPROSTOL 200 MCG PO TABS: ORAL_TABLET | ORAL | 1 refills | Status: DC

## 2020-05-10 MED ORDER — OXYCODONE-ACETAMINOPHEN 5-325 MG PO TABS: 1.0000 | ORAL_TABLET | Freq: Four times a day (QID) | ORAL | 0 refills | Status: DC | PRN

## 2020-05-10 NOTE — Discharge Instructions (Signed)
Managing Pregnancy Loss Pregnancy loss can happen any time during a pregnancy. Often the cause is not known. It is rarely because of anything you did. Pregnancy loss in early pregnancy (during the first trimester) is called a miscarriage. This type of pregnancy loss is the most common. Pregnancy loss that happens after 20 weeks of pregnancy is called fetal demise if the baby's heart stops beating before birth. Fetal demise is much less common. Some women experience spontaneous labor shortly after fetal demise resulting in a stillborn birth (stillbirth). Any pregnancy loss can be devastating. You will need to recover both physically and emotionally. Most women are able to get pregnant again after a pregnancy loss and deliver a healthy baby. How to manage emotional recovery Pregnancy loss is very hard emotionally. You may feel many different emotions while you grieve. You may feel sad and angry. You may also feel guilty. It is normal to have periods of crying. Emotional recovery can take longer than physical recovery. It is different for everyone. Taking these steps can help you in managing this loss:  Remember that it is unlikely you did anything to cause the pregnancy loss.  Share your thoughts and feelings with friends, family, and your partner. Remember that your partner is also recovering emotionally.  Make sure you have a good support system. Do not spend too much time alone.  Meet with a pregnancy loss counselor or join a pregnancy loss support group.  Get enough sleep and eat a healthy diet. Return to regular exercise when you have recovered physically.  Do not use drugs or alcohol to manage your emotions.  Consider seeing a mental health professional to help you recover emotionally.  Ask a friend or loved one to help you decide what to do with any clothing and nursery items you received for your baby. In the case of a stillbirth, many women benefit from taking additional steps in the  grieving process. You may want to:  Hold your baby after the birth.  Name your baby.  Request a birth certificate.  Create a keepsake such as handprints or footprints.  Dress your baby and have a picture taken.  Make funeral arrangements.  Ask for a baptism or blessing. Hospitals have staff members who can help you with all these arrangements.   How to recognize emotional stress It is normal to have emotional stress after a pregnancy loss. But emotional stress that lasts a long time or becomes severe requires treatment. Watch out for these signs of severe emotional stress:  Sadness, anger, or guilt that is not going away and is interfering with your normal activities.  Relationship problems that have occurred or gotten worse since the pregnancy loss.  Signs of depression that last longer than 2 weeks. These may include: ? Sadness. ? Anxiety. ? Hopelessness. ? Loss of interest in activities you enjoy. ? Inability to concentrate. ? Trouble sleeping or sleeping too much. ? Loss of appetite or overeating. ? Thoughts of death or of hurting yourself. Follow these instructions at home:  Take over-the-counter and prescription medicines only as told by your health care provider.  Rest at home until your energy level returns. Return to your normal activities as told by your health care provider. Ask your health care provider what activities are safe for you.  When you are ready, meet with your health care provider to discuss steps to take for a future pregnancy.  Keep all follow-up visits as told by your health care provider. This  is important. Where to find support  To help you and your partner with the process of grieving, talk with your health care provider or seek counseling.  Consider meeting with others who have experienced pregnancy loss. Ask your health care provider about support groups and resources. Where to find more information  U.S. Department of Health and Passenger transport manager on Women's Health: http://hoffman.com/  American Pregnancy Association: www.americanpregnancy.org Contact a health care provider if:  You continue to experience grief, sadness, or lack of motivation for everyday activities, and those feelings do not improve over time.  You are struggling to recover emotionally, especially if you are using alcohol or substances to help. Get help right away if:  You have thoughts of hurting yourself or others. If you ever feel like you may hurt yourself or others, or have thoughts about taking your own life, get help right away. You can go to your nearest emergency department or call:  Your local emergency services (911 in the [place]  A suicide crisis helpline, such as the National Suicide Prevention Lifeline at 567-605-1792. This is open 24 hours a day. Summary  Any pregnancy loss can be difficult physically and emotionally.  You may experience many different emotions while you grieve. Emotional recovery can last longer than physical recovery.  It is normal to have emotional stress after a pregnancy loss. But emotional stress that lasts a long time or becomes severe requires treatment.  See your health care provider if you are struggling emotionally after a pregnancy loss. This information is not intended to replace advice given to you by your health care provider. Make sure you discuss any questions you have with your health care provider. Document Revised: 07/09/2018 Document Reviewed: 05/30/2017 Elsevier Patient Education  2021 Elsevier Inc. Incomplete Miscarriage An incomplete miscarriage happens when tissue from pregnancy or part of the placenta, known as products of conception, remain in the body after a miscarriage. A miscarriage is the loss of pregnancy before the 20th week. Most miscarriages happen in the first 3 months of pregnancy. Sometimes, a miscarriage happens before a woman knows she is pregnant. Having a miscarriage  can be an emotional experience. If you have had a miscarriage, talk with your health care provider about any questions you may have about:  The loss of your baby.  The grieving process.  Your future pregnancy plans. What are the causes? Many times, the cause of an incomplete miscarriage is not known. What increases the risk? The following factors may make a pregnant woman more likely to have an incomplete miscarriage:  Using a watch-and-wait approach (expectant management) to treat a miscarriage.  Using medicine to treat a miscarriage. What are the signs or symptoms? Symptoms of this condition include:  Vaginal bleeding or spotting, with or without cramps or pain.  Pain or cramping in the abdomen or lower back.  Fluid or tissue coming out of the vagina. How is this diagnosed? This condition may be diagnosed based on:  A physical exam.  Ultrasound. How is this treated? An incomplete miscarriage may be treated with:  Dilation and curettage (D&C). In this procedure, the cervix is stretched open and any remaining pregnancy tissue is removed from the lining of the uterus (endometrium). The cervix is the lowest part of the uterus, which opens into the vagina.  Medicines. These may include: ? Antibiotic medicine, to treat infection. ? Medicine to help any remaining pregnancy tissue come out of the uterus. ? Medicine to reduce (contract) the size of  the uterus. These medicines may be given if there is a lot of bleeding. If you have Rh-negative blood, you may be given an injection of Rho(D) immune globulin to help prevent problems with future pregnancies. Follow these instructions at home: Medicines  Take over-the-counter and prescription medicines only as told by your health care provider.  If you were prescribed antibiotic medicine, take your antibiotic as told by your health care provider. Do not stop taking the antibiotic even if you start to feel better. Activity  Rest as  told by your health care provider. Ask your health care provider what activities are safe for you.  Have someone help with home and family responsibilities during this time. General instructions  Monitor how much tissue or blood comes out of the vagina.  Do not have sex, douche, or put anything in your vagina, such as tampons, until your health care provider says it is okay.  To help you and your partner with the grieving process, talk with your health care provider or get counseling to help deal with the pregnancy loss.  When you are ready, meet with your health care provider to discuss any important steps you should take for your health. Also, discuss steps you should take to have a healthy pregnancy in the future.  Keep all follow-up visits. This is important.   Where to find more information  The [employer] of Obstetricians and Gynecologists: acog.org  U.S. Department of Health and Cytogeneticist of Women's Health: http://hoffman.com/ Contact a health care provider if:  You have a fever or chills.  There is bad-smelling fluid coming from your vagina.  You have more bleeding instead of less.  Tissue or blood clots come out of your vagina. Get help right away if:  You have severe cramps or pain in your back or abdomen.  Heavy bleeding soaks through 2 large sanitary pads an hour for more than 2 hours.  You become light-headed or weak.  You faint.  You feel sad, and your sadness takes over your thoughts.  You think about hurting yourself. If you ever feel like you may hurt yourself or others, or have thoughts about taking your own life, get help right away. Go to your nearest emergency department or:  Call your local emergency services (911 in the [place]  Call a suicide crisis helpline, such as the National Suicide Prevention Lifeline at 873-067-3059. This is open 24 hours a day in the [place]  Text the Crisis Text Line at 819-625-0551 (in the  [place] Summary  An incomplete miscarriage happens when tissue from pregnancy or part of the placenta, known as products of conception, remain in the body after a miscarriage.  Treatment may include a dilation and curettage (D&C) procedure or medicines. In a D&C procedure, tissue is removed from the uterus.  Rest as told by your health care provider. Ask your health care provider what activities are safe for you.  To help you and your partner with the grieving process, talk with your health care provider or get counseling to help deal with the pregnancy loss. This information is not intended to replace advice given to you by your health care provider. Make sure you discuss any questions you have with your health care provider. Document Revised: 09/18/2019 Document Reviewed: 09/18/2019 Elsevier Patient Education  2021 [employer].

## 2020-05-10 NOTE — Telephone Encounter (Signed)
Pt has NOB appt 05/16/20. Pt called stating she has had brown discharge for the past week and today she had some bright red discharge. Pt states it is not heavy and it is not enough to wear a pad. Pt denies cramping and denies recent intercourse. I told pt she could monitor the discharge and if it increases and/or has cramping associated with it then she should report to MAU. I also gave pt the option to go to the MAU now to be evaluated and put her mind at ease. Pt given location of MAU.

## 2020-05-10 NOTE — MAU Note (Signed)
PT SAYS SHE DID HPT ON 04-09-20- POSITIVE . SINCE HAS BEEN FEELING CRAMPS.    ON 04-14-20- STARTED HAVING BROWNISH- REDDISH D/C- WHEN WIPING .   SINCE Sunday - WHEN WIPING - RED  D/C.     AT 2 PM  - BRIGHT RED IN UNDERWEAR. THEN AT 730PM- ON UNDERWEAR AND PANTS - AND QUARTER SIZE CLOTS .    PNC WITH [facility] - MONITOR - COME HERE. Marland Kitchen FIRST APPOINTMENT IS Monday.

## 2020-05-10 NOTE — MAU Provider Note (Signed)
Chief Complaint: Vaginal Bleeding and Abdominal Pain   Event Date/Time   First Provider Initiated Contact with Patient 05/10/20 2119        SUBJECTIVE HPI: Donna Huang is a 20 y.o. G2P1001 at [redacted]w[redacted]d by LMP who presents to maternity admissions reporting vaginal bleeding since 04/14/20.  Started out as brown and this week turned red.  Has not been seen for this pregnancy yet. . She denies vaginal itching/burning, urinary symptoms, h/a, dizziness, n/v, or fever/chills.    Vaginal Bleeding The patient's primary symptoms include vaginal bleeding. The patient's pertinent negatives include no genital itching, genital lesions, genital odor or pelvic pain. This is a new problem. The current episode started 1 to 4 weeks ago. The problem occurs intermittently. The problem has been gradually worsening. The pain is mild. She is pregnant. Associated symptoms include abdominal pain. Pertinent negatives include no back pain, chills, constipation, diarrhea or fever. The vaginal discharge was bloody. The vaginal bleeding is lighter than menses. She has been passing clots. She has not been passing tissue. Nothing aggravates the symptoms. She has tried nothing for the symptoms. She uses nothing for contraception.  Abdominal Pain This is a new problem. The current episode started 1 to 4 weeks ago. The onset quality is gradual. The problem occurs intermittently. The problem has been unchanged. The quality of the pain is cramping. Pertinent negatives include no constipation, diarrhea or fever. Nothing aggravates the pain. The pain is relieved by nothing. She has tried nothing for the symptoms.    RN Note: PT SAYS SHE DID HPT ON 04-09-20- POSITIVE . SINCE HAS BEEN FEELING CRAMPS.    ON 04-14-20- STARTED HAVING BROWNISH- REDDISH D/C- WHEN WIPING .   SINCE Sunday - WHEN WIPING - RED  D/C.     AT 2 PM  - BRIGHT RED IN UNDERWEAR. THEN AT 730PM- ON UNDERWEAR AND PANTS - AND QUARTER SIZE CLOTS .    PNC WITH [facility] -  COME HERE. Marland Kitchen FIRST APPOINTMENT IS Monday.   Past Medical History:  Diagnosis Date  . ADD (attention deficit disorder)   . ADHD (attention deficit hyperactivity disorder)    Past Surgical History:  Procedure Laterality Date  . KNEE SURGERY Left   . TONSILLECTOMY     Social History   Socioeconomic History  . Marital status: Single    Spouse name: Not on file  . Number of children: Not on file  . Years of education: Not on file  . Highest education level: Not on file  Occupational History  . Not on file  Tobacco Use  . Smoking status: Never Smoker  . Smokeless tobacco: Never Used  Vaping Use  . Vaping Use: Never used  Substance and Sexual Activity  . Alcohol use: No  . Drug use: No  . Sexual activity: Yes    Birth control/protection: None  Other Topics Concern  . Not on file  Social History Narrative  . Not on file   Social Determinants of Health   Financial Resource Strain: Not on file  Food Insecurity: Not on file  Transportation Needs: Not on file  Physical Activity: Not on file  Stress: Not on file  Social Connections: Not on file  Intimate Partner Violence: Not on file   No current facility-administered medications on file prior to encounter.   Current Outpatient Medications on File Prior to Encounter  Medication Sig Dispense Refill  . Prenatal Vit-Fe Fumarate-FA (PRENATAL VITAMINS PO) Take 1 capsule by mouth daily.    Marland Kitchen  acetaminophen (TYLENOL) 325 MG tablet Take 2 tablets (650 mg total) by mouth every 6 (six) hours as needed (for pain scale < 4). 30 tablet 0  . ibuprofen (ADVIL) 600 MG tablet Take 1 tablet (600 mg total) by mouth every 8 (eight) hours as needed for mild pain. 30 tablet 0  . polyethylene glycol powder (GLYCOLAX/MIRALAX) 17 GM/SCOOP powder Take 17 g by mouth daily as needed. (Patient not taking: Reported on 10/01/2019) 510 g 1   No Known Allergies  I have reviewed patient's Past Medical Hx, Surgical Hx, Family Hx, Social Hx, medications and  allergies.   ROS:  Review of Systems  Constitutional: Negative for chills and fever.  Gastrointestinal: Positive for abdominal pain. Negative for constipation and diarrhea.  Genitourinary: Positive for vaginal bleeding. Negative for pelvic pain.  Musculoskeletal: Negative for back pain.   Review of Systems  Other systems negative   Physical Exam  Physical Exam Patient Vitals for the past 24 hrs:  BP Temp Temp src Pulse Resp Height Weight  05/10/20 2056 121/63 98.4 F (36.9 C) Oral 76 20 5\' 3"  (1.6 m) 73.3 kg   Constitutional: Well-developed, well-nourished female in no acute distress.  Cardiovascular: normal rate Respiratory: normal effort GI: Abd soft, non-tender. Pos BS x 4 MS: Extremities nontender, no edema, normal ROM Neurologic: Alert and oriented x 4.  GU: Neg CVAT.  PELVIC EXAM: Cervix pink, visually closed, without lesion, scant light red discharge, vaginal walls and external genitalia normal Bimanual exam: Cervix 0/long/high, firm, anterior, neg CMT, uterus nontender, enlarged to 6 wk size, adnexa without tenderness, enlargement, or mass   LAB RESULTS Results for orders placed or performed during the hospital encounter of 05/10/20 (from the past 24 hour(s))  Urinalysis, Routine w reflex microscopic Urine, Clean Catch     Status: Abnormal   Collection Time: 05/10/20  9:08 PM  Result Value Ref Range   Color, Urine STRAW (A) YELLOW   APPearance CLEAR CLEAR   Specific Gravity, Urine 1.005 1.005 - 1.030   pH 7.0 5.0 - 8.0   Glucose, UA NEGATIVE NEGATIVE mg/dL   Hgb urine dipstick MODERATE (A) NEGATIVE   Bilirubin Urine NEGATIVE NEGATIVE   Ketones, ur NEGATIVE NEGATIVE mg/dL   Protein, ur NEGATIVE NEGATIVE mg/dL   Nitrite NEGATIVE NEGATIVE   Leukocytes,Ua NEGATIVE NEGATIVE   RBC / HPF 0-5 0 - 5 RBC/hpf   WBC, UA 0-5 0 - 5 WBC/hpf   Bacteria, UA NONE SEEN NONE SEEN   Mucus PRESENT   Pregnancy, urine POC     Status: Abnormal   Collection Time: 05/10/20  9:08 PM   Result Value Ref Range   Preg Test, Ur POSITIVE (A) NEGATIVE  CBC     Status: None   Collection Time: 05/10/20  9:23 PM  Result Value Ref Range   WBC 7.8 4.0 - 10.5 K/uL   RBC 4.52 3.87 - 5.11 MIL/uL   Hemoglobin 14.4 12.0 - 15.0 g/dL   HCT 07/08/20 78.9 - 38.1 %   MCV 95.6 80.0 - 100.0 fL   MCH 31.9 26.0 - 34.0 pg   MCHC 33.3 30.0 - 36.0 g/dL   RDW 01.7 51.0 - 25.8 %   Platelets 202 150 - 400 K/uL   nRBC 0.0 0.0 - 0.2 %  hCG, quantitative, pregnancy     Status: Abnormal   Collection Time: 05/10/20  9:23 PM  Result Value Ref Range   hCG, Beta Chain, Quant, S 8,229 (H) <5 mIU/mL  Rh IG workup (  includes ABO/Rh)     Status: None (Preliminary result)   Collection Time: 05/10/20  9:23 PM  Result Value Ref Range   Gestational Age(Wks) 8    ABO/RH(D) O NEG    Antibody Screen NEG    Unit Number [id number]    Blood Component Type RHIG    Unit division 00    Status of Unit ISSUED    Transfusion Status      OK TO TRANSFUSE Performed at [facility], [address], [place], [place] [address]   HIV Antibody (routine testing w rflx)     Status: None   Collection Time: 05/10/20  9:23 PM  Result Value Ref Range   HIV Screen 4th Generation wRfx Non Reactive Non Reactive  Wet prep, genital     Status: Abnormal   Collection Time: 05/10/20  9:37 PM   Specimen: Vaginal  Result Value Ref Range   Yeast Wet Prep HPF POC NONE SEEN NONE SEEN   Trich, Wet Prep NONE SEEN NONE SEEN   Clue Cells Wet Prep HPF POC NONE SEEN NONE SEEN   WBC, Wet Prep HPF POC MANY (A) NONE SEEN   Sperm NONE SEEN      --/--/O NEG (06/04 0448)  IMAGING US OB Comp Less 14 Wks  Result Date: 05/10/2020 CLINICAL DATA:  Vaginal bleeding EXAM: OBSTETRIC <14 WK [place] AND TRANSVAGINAL OB US TECHNIQUE: Both transabdominal and transvaginal ultrasound examinations were performed for complete evaluation of the gestation as well as the maternal uterus, adnexal regions, and pelvic cul-de-sac. Transvaginal technique was  performed to assess early pregnancy. COMPARISON:  None. FINDINGS: Intrauterine gestational sac: Single Yolk sac:  Not visualized Embryo:  Visualized Cardiac Activity: Not visualized Heart Rate:   bpm MSD:   mm    w     d CRL:  12.3 mm   7 w   3 d                  [place] EDC: 12/24/2020 Subchorionic hemorrhage:  None visualized. Maternal uterus/adnexae: No adnexal mass or free fluid. IMPRESSION: Seven week 3 day intrauterine pregnancy. No fetal heart tones detected. Findings meet definitive criteria for failed pregnancy. This follows SRU consensus guidelines: Diagnostic Criteria for Nonviable Pregnancy Early in the First Trimester. Macy Mis J Med [id number]. Electronically Signed   By: Charlett Nose M.D.   On: 05/10/2020 22:05   US OB Transvaginal  Result Date: 05/10/2020 CLINICAL DATA:  Vaginal bleeding EXAM: OBSTETRIC <14 WK [place] AND TRANSVAGINAL OB US TECHNIQUE: Both transabdominal and transvaginal ultrasound examinations were performed for complete evaluation of the gestation as well as the maternal uterus, adnexal regions, and pelvic cul-de-sac. Transvaginal technique was performed to assess early pregnancy. COMPARISON:  None. FINDINGS: Intrauterine gestational sac: Single Yolk sac:  Not visualized Embryo:  Visualized Cardiac Activity: Not visualized Heart Rate:   bpm MSD:   mm    w     d CRL:  12.3 mm   7 w   3 d                  [place] EDC: 12/24/2020 Subchorionic hemorrhage:  None visualized. Maternal uterus/adnexae: No adnexal mass or free fluid. IMPRESSION: Seven week 3 day intrauterine pregnancy. No fetal heart tones detected. Findings meet definitive criteria for failed pregnancy. This follows SRU consensus guidelines: Diagnostic Criteria for Nonviable Pregnancy Early in the First Trimester. Macy Mis J Med [phone]. Electronically Signed   By: Charlett Nose M.D.  On: 05/10/2020 22:05     MAU Management/MDM: Ordered usual first trimester r/o ectopic labs.   Pelvic exam and cultures done Will check  baseline Ultrasound to rule out ectopic.  This bleeding/pain can represent a normal pregnancy with bleeding, spontaneous abortion or even an ectopic which can be life-threatening.  The process as listed above helps to determine which of these is present.  Reviewed results with patient and FOB who are grieving as expected Reviewed options for management including expectant management, Cytotec, and D&E They want to get Rx sent and will decide whether to take the Cytotec or not They will call office if they decide to proceed with D&E   ASSESSMENT Pregnancy at [redacted]w[redacted]d Fetal Demise Missed abortion  PLAN Discharge home  Early Intrauterine Pregnancy Failure Protocol  X Documented intrauterine pregnancy failure less than or equal to [redacted] weeks gestation  X No serious current illness  X Baseline Hgb greater than or equal to 10g/dl  X Patient has easily accessible transportation to the hospital  X Practitioner/physician deems patient reliable  X Counseling by practitioner or physician  X Patient education by RN  Rho-Gam given by RN if indicated  Cytotec 800 mcg Buccally by patient at home  X Ibuprofen 600 mg 1 tablet by mouth every 6 hours as needed #30 - prescribed  X Percocet by mouth every 4 to 6 hours as needed - prescribed  __ Phenergan 25 mg by mouth every 4 hours as needed for nausea - prescribed   MEssage sent to office for followup anc PP scheduling Pt stable at time of discharge. Encouraged to return here if she develops worsening of symptoms, increase in pain, fever, or other concerning symptoms.    Wynelle Bourgeois CNM, MSN Certified Nurse-Midwife 05/10/2020  9:19 PM

## 2020-05-11 ENCOUNTER — Telehealth: Payer: Self-pay

## 2020-05-11 ENCOUNTER — Other Ambulatory Visit: Payer: Self-pay | Admitting: Family Medicine

## 2020-05-11 LAB — RH IG WORKUP (INCLUDES ABO/RH)
ABO/RH(D): O NEG
Antibody Screen: NEGATIVE
Gestational Age(Wks): 8
Unit division: 0

## 2020-05-11 MED ORDER — OXYCODONE-ACETAMINOPHEN 5-325 MG PO TABS: 1.0000 | ORAL_TABLET | ORAL | 0 refills | Status: DC | PRN

## 2020-05-11 NOTE — MAU Note (Signed)
Patient signed printed AVS. Placed in chart. 

## 2020-05-11 NOTE — Telephone Encounter (Signed)
Pt called stating that she cannot pick up her Oxycodone. Pt states pharmacist needs Rx corrected. I spoke to pharmacy and they gave me instructions to let provider know the max amounts of tablets they can provide at a time is 6 due to pharmacy protocols. Message sent to Wynelle Bourgeois, CNM to correct Rx and send to pharmacy.

## 2020-05-12 LAB — GC/CHLAMYDIA PROBE AMP (~~LOC~~) NOT AT ARMC
Chlamydia: NEGATIVE
Comment: NEGATIVE
Comment: NORMAL
Neisseria Gonorrhea: NEGATIVE

## 2020-05-16 ENCOUNTER — Encounter: Payer: Managed Care, Other (non HMO) | Admitting: Obstetrics & Gynecology

## 2020-05-26 ENCOUNTER — Other Ambulatory Visit: Payer: Self-pay

## 2020-05-26 ENCOUNTER — Ambulatory Visit (INDEPENDENT_AMBULATORY_CARE_PROVIDER_SITE_OTHER): Payer: Managed Care, Other (non HMO) | Admitting: Obstetrics and Gynecology

## 2020-05-26 ENCOUNTER — Encounter: Payer: Self-pay | Admitting: Obstetrics and Gynecology

## 2020-05-26 VITALS — BP 124/82 | HR 81 | Ht 64.0 in | Wt 159.0 lb

## 2020-05-26 DIAGNOSIS — O039 Complete or unspecified spontaneous abortion without complication: Secondary | ICD-10-CM | POA: Diagnosis not present

## 2020-05-26 LAB — POCT URINE PREGNANCY: Preg Test, Ur: NEGATIVE

## 2020-05-26 NOTE — Progress Notes (Signed)
GYNECOLOGY OFFICE FOLLOW UP NOTE  History:  20 y.o. [id number] here today for follow up for SAB. Had vaginal bleeding x 1 month after positive pregnancy test and went to [facility]. Was diagnosed with failed pregnancy at 7 weeks at that point. She was given cytotec and took it two days later. Had heavy bleeding at that time which stopped after a couple of days. Then one week later (one week ago today), she passed a large amount of tissue which she and her mother feel was the pregnancy tissue, state it looked different than regular clot and looked like tissue. She has not had any bleeding since. No fever, chills, pain. Otherwise feeling well.   Past Medical History:  Diagnosis Date  . ADD (attention deficit disorder)   . ADHD (attention deficit hyperactivity disorder)     Past Surgical History:  Procedure Laterality Date  . KNEE SURGERY Left   . TONSILLECTOMY       Current Outpatient Medications:  .  amphetamine-dextroamphetamine (ADDERALL) 20 MG tablet, Take by mouth., Disp: , Rfl:  .  Prenatal Vit-Fe Fumarate-FA (PRENATAL VITAMINS PO), Take 1 capsule by mouth daily., Disp: , Rfl:  .  acetaminophen (TYLENOL) 325 MG tablet, Take 2 tablets (650 mg total) by mouth every 6 (six) hours as needed (for pain scale < 4). (Patient not taking: Reported on 05/26/2020), Disp: 30 tablet, Rfl: 0 .  ibuprofen (ADVIL) 600 MG tablet, Take 1 tablet (600 mg total) by mouth every 8 (eight) hours as needed for mild pain. (Patient not taking: Reported on 05/26/2020), Disp: 30 tablet, Rfl: 0 .  misoprostol (CYTOTEC) 200 MCG tablet, Insert four tablets buccally.   Repeat the next day if no results (Patient not taking: Reported on 05/26/2020), Disp: 4 tablet, Rfl: 1 .  oxyCODONE-acetaminophen (PERCOCET/ROXICET) 5-325 MG tablet, Take 1 tablet by mouth every 4 (four) hours as needed for severe pain. (Patient not taking: Reported on 05/26/2020), Disp: 10 tablet, Rfl: 0 .  polyethylene glycol powder (GLYCOLAX/MIRALAX) 17 GM/SCOOP  powder, Take 17 g by mouth daily as needed. (Patient not taking: No sig reported), Disp: 510 g, Rfl: 1 .  promethazine (PHENERGAN) 25 MG tablet, Take 1 tablet (25 mg total) by mouth every 6 (six) hours as needed for nausea or vomiting. (Patient not taking: Reported on 05/26/2020), Disp: 20 tablet, Rfl: 0  The following portions of the patient's history were reviewed and updated as appropriate: allergies, current medications, past family history, past medical history, past social history, past surgical history and problem list.   Review of Systems:  Pertinent items noted in HPI and remainder of comprehensive ROS otherwise negative.   Objective:  Physical Exam BP 124/82   Pulse 81   Ht 5\' 4"  (1.626 m)   Wt 159 lb (72.1 kg)   LMP 03/14/2020   Breastfeeding Unknown   BMI 27.29 kg/m  CONSTITUTIONAL: Well-developed, well-nourished female in no acute distress.  HENT:  Normocephalic, atraumatic. External right and left ear normal. Oropharynx is clear and moist EYES: Conjunctivae and EOM are normal. Pupils are equal, round, and reactive to light. No scleral icterus.  NECK: Normal range of motion, supple, no masses SKIN: Skin is warm and dry. No rash noted. Not diaphoretic. No erythema. No pallor. NEUROLOGIC: Alert and oriented to person, place, and time. Normal reflexes, muscle tone coordination. No cranial nerve deficit noted. PSYCHIATRIC: Normal mood and affect. Normal behavior. Normal judgment and thought content. CARDIOVASCULAR: Normal heart rate noted RESPIRATORY: Effort normal, no problems with respiration noted  ABDOMEN: Soft, no distention noted.   PELVIC: deferred MUSCULOSKELETAL: Normal range of motion. No edema noted.  Labs and Imaging   Assessment & Plan:  1. SAB (spontaneous abortion) - Given heavy bleeding and passage of significant amount of tissue, suspect patient has completed SAB - provided reassurance that she has likely passed all tissue - gave precautions to watch out  for  - guidance given regarding etiology of SAB and likelihood of recurring - may start trying for new pregnancy when she feels able - provided comfort regarding pregnancy loss, grief and offered resources - answered all questions for patient and her mother - POCT urine pregnancy   Routine preventative health maintenance measures emphasized. Please refer to After Visit Summary for other counseling recommendations.   Return if symptoms worsen or fail to improve.  Total face-to-face time with patient: 25 minutes. Over 50% of encounter was spent on counseling and coordination of care.  Baldemar Lenis, MD, [facility] [employer] [facility])

## 2020-08-01 ENCOUNTER — Other Ambulatory Visit: Payer: Self-pay | Admitting: *Deleted

## 2020-08-01 DIAGNOSIS — Z3490 Encounter for supervision of normal pregnancy, unspecified, unspecified trimester: Secondary | ICD-10-CM

## 2020-08-18 ENCOUNTER — Telehealth: Payer: Self-pay | Admitting: *Deleted

## 2020-08-18 MED ORDER — PROMETHAZINE HCL 25 MG PO TABS: 25.0000 mg | ORAL_TABLET | Freq: Four times a day (QID) | ORAL | 1 refills | Status: DC | PRN

## 2020-08-18 NOTE — Telephone Encounter (Signed)
-----   Message from Acquanetta Sit, [place] sent at 08/17/2020  1:12 PM EDT ----- Regarding: Medication Patient wants to know if she can be prescribed any nausea medication prior to her New OB appointment on 08/26/2020? Patient was advised to start with saltines and ginger ale in the morning. She states that it is also when she eats.

## 2020-08-18 NOTE — Telephone Encounter (Signed)
Pt is pregnant and is requesting an antimetic for nausea.  Phenergan 25 mg sent to her pharmacy.

## 2020-08-25 ENCOUNTER — Other Ambulatory Visit: Payer: Self-pay

## 2020-08-25 ENCOUNTER — Ambulatory Visit (INDEPENDENT_AMBULATORY_CARE_PROVIDER_SITE_OTHER): Payer: Managed Care, Other (non HMO)

## 2020-08-25 DIAGNOSIS — Z3687 Encounter for antenatal screening for uncertain dates: Secondary | ICD-10-CM | POA: Diagnosis not present

## 2020-08-25 DIAGNOSIS — O3680X0 Pregnancy with inconclusive fetal viability, not applicable or unspecified: Secondary | ICD-10-CM

## 2020-08-25 DIAGNOSIS — Z3490 Encounter for supervision of normal pregnancy, unspecified, unspecified trimester: Secondary | ICD-10-CM

## 2020-08-25 DIAGNOSIS — Z3A08 8 weeks gestation of pregnancy: Secondary | ICD-10-CM | POA: Diagnosis not present

## 2020-08-26 ENCOUNTER — Other Ambulatory Visit (HOSPITAL_COMMUNITY)
Admission: RE | Admit: 2020-08-26 | Discharge: 2020-08-26 | Disposition: A | Discharge status: Home / Self Care | Payer: Managed Care, Other (non HMO) | Source: Ambulatory Visit | Attending: Certified Nurse Midwife | Admitting: Certified Nurse Midwife

## 2020-08-26 ENCOUNTER — Encounter: Payer: Self-pay | Admitting: Certified Nurse Midwife

## 2020-08-26 ENCOUNTER — Ambulatory Visit (INDEPENDENT_AMBULATORY_CARE_PROVIDER_SITE_OTHER): Payer: Managed Care, Other (non HMO) | Admitting: Certified Nurse Midwife

## 2020-08-26 VITALS — BP 109/69 | HR 84 | Wt 161.0 lb

## 2020-08-26 DIAGNOSIS — Z3480 Encounter for supervision of other normal pregnancy, unspecified trimester: Secondary | ICD-10-CM | POA: Insufficient documentation

## 2020-08-26 DIAGNOSIS — O09899 Supervision of other high risk pregnancies, unspecified trimester: Secondary | ICD-10-CM

## 2020-08-26 DIAGNOSIS — Z6791 Unspecified blood type, Rh negative: Secondary | ICD-10-CM | POA: Insufficient documentation

## 2020-08-26 DIAGNOSIS — Z3A08 8 weeks gestation of pregnancy: Secondary | ICD-10-CM

## 2020-08-26 DIAGNOSIS — O26811 Pregnancy related exhaustion and fatigue, first trimester: Secondary | ICD-10-CM

## 2020-08-26 DIAGNOSIS — K5900 Constipation, unspecified: Secondary | ICD-10-CM

## 2020-08-26 DIAGNOSIS — O26891 Other specified pregnancy related conditions, first trimester: Secondary | ICD-10-CM

## 2020-08-26 DIAGNOSIS — O210 Mild hyperemesis gravidarum: Secondary | ICD-10-CM

## 2020-08-26 DIAGNOSIS — O26899 Other specified pregnancy related conditions, unspecified trimester: Secondary | ICD-10-CM | POA: Insufficient documentation

## 2020-08-26 LAB — VITAMIN D 25 HYDROXY (VIT D DEFICIENCY, FRACTURES): Vit D, 25-Hydroxy: 22 ng/mL — ABNORMAL LOW (ref 30–100)

## 2020-08-26 LAB — TSH: TSH: 1.39 mIU/L

## 2020-08-26 MED ORDER — FAMOTIDINE 20 MG PO TABS: 20.0000 mg | ORAL_TABLET | Freq: Every day | ORAL | 0 refills | Status: DC

## 2020-08-26 MED ORDER — DOXYLAMINE-PYRIDOXINE 10-10 MG PO TBEC: 2.0000 | DELAYED_RELEASE_TABLET | Freq: Every day | ORAL | 1 refills | Status: DC

## 2020-08-26 NOTE — Patient Instructions (Signed)
Morning Sickness  Morning sickness is when you feel like you may vomit (feel nauseous) during pregnancy. Sometimes, you may vomit. Morning sickness most often happens in the morning, but it can also happen at any time of the day. Some women may have morning sickness that makes them vomit all the time. This is a more serious problem that needs treatment. What are the causes? The cause of this condition is not known. What increases the risk?  You had vomiting or a feeling like you may vomit before your pregnancy.  You had morning sickness in another pregnancy.  You are pregnant with more than one baby, such as twins. What are the signs or symptoms?  Feeling like you may vomit.  Vomiting. How is this treated? Treatment is usually not needed for this condition. You may only need to change what you eat. In some cases, your doctor may give you some things to take for your condition. These include:  Vitamin B6 supplements.  Medicines to treat the feeling that you may vomit.  Ginger. Follow these instructions at home: Medicines  Take over-the-counter and prescription medicines only as told by your doctor. Do not take any medicines until you talk with your doctor about them first.  Take multivitamins before you get pregnant. These can stop or lessen the symptoms of morning sickness. Eating and drinking  Eat dry toast or crackers before getting out of bed.  Eat 5 or 6 small meals a day.  Eat dry and bland foods like rice and baked potatoes.  Do not eat greasy, fatty, or spicy foods.  Have someone cook for you if the smell of food causes you to vomit or to feel like you may vomit.  If you feel like you may vomit after taking prenatal vitamins, take them at night or with a snack.  Eat protein foods when you need a snack. Nuts, yogurt, and cheese are good choices.  Drink fluids throughout the day.  Try ginger ale made with real ginger, ginger tea made from fresh grated ginger, or  ginger candies. General instructions  Do not smoke or use any products that contain nicotine or tobacco. If you need help quitting, ask your doctor.  Use an air purifier to keep the air in your house free of smells.  Get lots of fresh air.  Try to avoid smells that make you feel sick.  Try wearing an acupressure wristband. This is a wristband that is used to treat seasickness.  Try a treatment called acupuncture. In this treatment, a doctor puts needles into certain areas of your body to make you feel better. Contact a doctor if:  You need medicine to feel better.  You feel dizzy or light-headed.  You are losing weight. Get help right away if:  The feeling that you may vomit will not go away, or you cannot stop vomiting.  You faint.  You have very bad pain in your belly. Summary  Morning sickness is when you feel like you may vomit (feel nauseous) during pregnancy.  You may feel sick in the morning, but you can feel this way at any time of the day.  Making some changes to what you eat may help your symptoms go away. This information is not intended to replace advice given to you by your health care provider. Make sure you discuss any questions you have with your health care provider. Document Revised: 11/02/2019 Document Reviewed: 10/12/2019 Elsevier Patient Education  2021 Elsevier Inc.   Constipation, Adult  Constipation is when a person has fewer than three bowel movements in a week, has difficulty having a bowel movement, or has stools (feces) that are dry, hard, or larger than normal. Constipation may be caused by an underlying condition. It may become worse with age if a person takes certain medicines and does not take in enough fluids. Follow these instructions at home: Eating and drinking  Eat foods that have a lot of fiber, such as beans, whole grains, and fresh fruits and vegetables.  Limit foods that are low in fiber and high in fat and processed sugars, such  as fried or sweet foods. These include french fries, hamburgers, cookies, candies, and soda.  Drink enough fluid to keep your urine pale yellow.   General instructions  Exercise regularly or as told by your health care provider. Try to do 150 minutes of moderate exercise each week.  Use the bathroom when you have the urge to go. Do not hold it in.  Take over-the-counter and prescription medicines only as told by your health care provider. This includes any fiber supplements.  During bowel movements: ? Practice deep breathing while relaxing the lower abdomen. ? Practice pelvic floor relaxation.  Watch your condition for any changes. Let your health care provider know about them.  Keep all follow-up visits as told by your health care provider. This is important. Contact a health care provider if:  You have pain that gets worse.  You have a fever.  You do not have a bowel movement after 4 days.  You vomit.  You are not hungry or you lose weight.  You are bleeding from the opening between the buttocks (anus).  You have thin, pencil-like stools. Get help right away if:  You have a fever and your symptoms suddenly get worse.  You leak stool or have blood in your stool.  Your abdomen is bloated.  You have severe pain in your abdomen.  You feel dizzy or you faint. Summary  Constipation is when a person has fewer than three bowel movements in a week, has difficulty having a bowel movement, or has stools (feces) that are dry, hard, or larger than normal.  Eat foods that have a lot of fiber, such as beans, whole grains, and fresh fruits and vegetables.  Drink enough fluid to keep your urine pale yellow.  Take over-the-counter and prescription medicines only as told by your health care provider. This includes any fiber supplements. This information is not intended to replace advice given to you by your health care provider. Make sure you discuss any questions you have with  your health care provider. Document Revised: 02/04/2019 Document Reviewed: 02/04/2019 Elsevier Patient Education  2021 [employer].

## 2020-08-26 NOTE — Progress Notes (Signed)
Subjective:   Donna Huang is a 20 y.o. G3P1011 at [redacted]w[redacted]d by early ultrasound being seen today for her first obstetrical visit.  Her obstetrical history is significant for  short pregnancy interval, Rh negative . Patient does intend to breast feed. Pregnancy history fully reviewed.  Patient reports fatigue, nausea and constipation . She has tried Phenergan but it doesn't help. She has also tried Miralax but throws it up.  HISTORY: OB History  Gravida Para Term Preterm AB Living  3 1 1  0 1 1  SAB IAB Ectopic Multiple Live Births  1 0 0 0 1    # Outcome Date GA Lbr Len/2nd Weight Sex Delivery Anes PTL Lv  3 Current           2 SAB 05/2020          1 Term 09/03/19 [redacted]w[redacted]d 06:16 / 01:18 7 lb 2.3 oz (3.24 kg) F Vag-Spont EPI  LIV     Name: Tavares,GIRL Cindia     Apgar1: 7  Apgar5: 9   Past Medical History:  Diagnosis Date   ADD (attention deficit disorder)    ADHD (attention deficit hyperactivity disorder)    Past Surgical History:  Procedure Laterality Date   KNEE SURGERY Left    TONSILLECTOMY     History reviewed. No pertinent family history. Social History   Tobacco Use   Smoking status: Never Smoker   Smokeless tobacco: Never Used  Vaping Use   Vaping Use: Never used  Substance Use Topics   Alcohol use: No   Drug use: No   No Known Allergies Current Outpatient Medications on File Prior to Visit  Medication Sig Dispense Refill   promethazine (PHENERGAN) 25 MG tablet Take 1 tablet (25 mg total) by mouth every 6 (six) hours as needed for nausea or vomiting. 30 tablet 1   Prenatal Vit-Fe Fumarate-FA (PRENATAL VITAMINS PO) Take 1 capsule by mouth daily. (Patient not taking: Reported on 08/26/2020)     No current facility-administered medications on file prior to visit.   Indications for ASA therapy (per uptodate) One of the following: Previous pregnancy with preeclampsia, especially early onset and with an adverse outcome No Multifetal gestation No Chronic  hypertension No Type 1 or 2 diabetes mellitus No Chronic kidney disease No Autoimmune disease (antiphospholipid syndrome, systemic lupus erythematosus) No   Two or more of the following: Nulliparity No Obesity (body mass index >30 kg/m2) No Family history of preeclampsia in mother or sister No Age ?35 years No Sociodemographic characteristics (African American race, low socioeconomic level) No Personal risk factors (eg, previous pregnancy with low birth weight or small for gestational age infant, previous adverse pregnancy outcome [eg, stillbirth], interval >10 years between pregnancies) No   Indications for early 1 hour GTT (per uptodate)  BMI >25 (>23 in Asian women) AND one of the following  Gestational diabetes mellitus in a previous pregnancy No Glycated hemoglobin ?5.7 percent (39 mmol/mol), impaired glucose tolerance, or impaired fasting glucose on previous testing No First-degree relative with diabetes No High-risk race/ethnicity (eg, African American, Latino, Native American, 08/28/2020 American, Pacific Islander) No History of cardiovascular disease No Hypertension or on therapy for hypertension No High-density lipoprotein cholesterol level <35 mg/dL ([place] mmol/L) and/or a triglyceride level >250 mg/dL (2.70 mmol/L) No Polycystic ovary syndrome No Physical inactivity No Other clinical condition associated with insulin resistance (eg, severe obesity, acanthosis nigricans) No Previous birth of an infant weighing ?4000 g No Previous stillbirth of unknown cause No  Exam   Vitals:   08/26/20 0930  BP: 109/69  Pulse: 84  Weight: 161 lb (73 kg)      Uterus:     Pelvic Exam: Perineum:    Vulva:    Vagina:     Cervix:    Adnexa:    Bony Pelvis:   System: General: well-developed, well-nourished female in no acute distress       Skin: normal coloration and turgor, no rashes   Neurologic: oriented, normal, negative, normal mood   Extremities: normal strength, tone, and  muscle mass, ROM of all joints is normal   HEENT PERRLA, extraocular movement intact and sclera clear, anicteric   Mouth/Teeth mucous membranes moist, pharynx normal without lesions and dental hygiene good   Neck supple and no masses   Cardiovascular: regular rate and rhythm   Respiratory:  no respiratory distress, normal breath sounds   Abdomen: soft, non-tender; bowel sounds normal; no masses,  no organomegaly     Assessment:   Pregnancy: G3P1011  Patient Active Problem List   Diagnosis Date Noted   Supervision of other normal pregnancy, antepartum 08/26/2020   Short interval between pregnancies affecting pregnancy, antepartum 08/26/2020   Rh negative status during pregnancy 08/26/2020   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 Supervision of other normal pregnancy, antepartum - Obstetric panel - HIV antibody (with reflex) - Hepatitis C Antibody - Hemoglobinopathy Evaluation - Culture, OB Urine - GC/Chlamydia probe amp ([place])not at [facility] - Babyscripts Schedule Optimization  2. Morning sickness - Doxylamine-Pyridoxine 10-10 MG TBEC; Take 2 tablets by mouth at bedtime. May also take 1 tab in am and 1 tab in afternoon  Dispense: 100 tablet; Refill: 1 - famotidine (PEPCID) 20 MG tablet; Take 1 tablet (20 mg total) by mouth at bedtime.  Dispense: 30 tablet; Refill: 0  3. Constipation, unspecified constipation type - prune juice - increase water and dietary fiber - Colace  4. Fatigue during pregnancy in first trimester - TSH - Vitamin D (25 hydroxy)  5. [redacted] weeks gestation of pregnancy  6. Short interval between pregnancies affecting pregnancy, antepartum  7. Rh negative status during pregnancy in first trimester   Initial labs drawn. Early 1 hour GTT n/a Started on ASA n/a Continue prenatal vitamins. Genetic Screening discussed, NIPS: requested. Ultrasound  discussed; fetal anatomic survey: requested. Problem list reviewed and updated The nature of [facility] with multiple MDs and other Advanced Practice Providers was explained to patient; also emphasized that fellows, residents, and students are part of our team. Routine obstetric precautions reviewed Return in about 6 weeks (around 10/07/2020).   Donette Larry, CNM 10:55 AM 08/26/20

## 2020-08-26 NOTE — Progress Notes (Signed)
Radiologist hasn't done u/s report yet. According to U/S tech: [id number] Dating: 8.4 EDD: 04/02/21 Pt will do NIPS at 12wks PHQ 9: Score 0 GAD: Score 0 Pt would like something other than Phenergan- states it isn't helping

## 2020-08-28 LAB — CULTURE, OB URINE

## 2020-08-28 LAB — URINE CULTURE, OB REFLEX

## 2020-08-30 LAB — GC/CHLAMYDIA PROBE AMP (~~LOC~~) NOT AT ARMC
Chlamydia: NEGATIVE
Comment: NEGATIVE
Comment: NORMAL
Neisseria Gonorrhea: NEGATIVE

## 2020-08-31 LAB — OBSTETRIC PANEL
Absolute Monocytes: 338 cells/uL (ref 200–950)
Antibody Screen: POSITIVE — AB
Basophils Absolute: 30 cells/uL (ref 0–200)
Basophils Relative: 0.4 %
Eosinophils Absolute: 68 cells/uL (ref 15–500)
Eosinophils Relative: 0.9 %
HCT: 41.4 % (ref 35.0–45.0)
Hemoglobin: 13.7 g/dL (ref 11.7–15.5)
Hepatitis B Surface Ag: NONREACTIVE
Lymphs Abs: 1223 cells/uL (ref 850–3900)
MCH: 31.6 pg (ref 27.0–33.0)
MCHC: 33.1 g/dL (ref 32.0–36.0)
MCV: 95.6 fL (ref 80.0–100.0)
MPV: 12.2 fL (ref 7.5–12.5)
Monocytes Relative: 4.5 %
Neutro Abs: 5843 cells/uL (ref 1500–7800)
Neutrophils Relative %: 77.9 %
Platelets: 177 10*3/uL (ref 140–400)
RBC: 4.33 10*6/uL (ref 3.80–5.10)
RDW: 11.8 % (ref 11.0–15.0)
RPR Ser Ql: NONREACTIVE
Rubella: 5.13 Index
Total Lymphocyte: 16.3 %
WBC: 7.5 10*3/uL (ref 3.8–10.8)

## 2020-08-31 LAB — HEMOGLOBINOPATHY EVALUATION
Fetal Hemoglobin Testing: <1 % (ref 0.0–1.9)
HCT: 42.4 % (ref 35.0–45.0)
Hemoglobin A2 - HGBRFX: 2.8 % (ref 2.2–3.2)
Hemoglobin: 14 g/dL (ref 11.7–15.5)
Hgb A: 97.2 % (ref >96.0)
MCH: 31.7 pg (ref 27.0–33.0)
MCV: 95.9 fL (ref 80.0–100.0)
RBC: 4.42 10*6/uL (ref 3.80–5.10)
RDW: 11.9 % (ref 11.0–15.0)

## 2020-08-31 LAB — HEPATITIS C ANTIBODY
Hepatitis C Ab: NONREACTIVE
SIGNAL TO CUT-OFF: 0.01 (ref <1.00)

## 2020-08-31 LAB — ANTIBODY ID, TITER, AND TYPING, RBC

## 2020-08-31 LAB — HIV ANTIBODY (ROUTINE TESTING W REFLEX): HIV 1&2 Ab, 4th Generation: NONREACTIVE

## 2020-09-02 ENCOUNTER — Encounter: Payer: Self-pay | Admitting: Certified Nurse Midwife

## 2020-09-02 DIAGNOSIS — E559 Vitamin D deficiency, unspecified: Secondary | ICD-10-CM | POA: Insufficient documentation

## 2020-09-22 ENCOUNTER — Other Ambulatory Visit: Payer: Self-pay

## 2020-09-22 ENCOUNTER — Ambulatory Visit (INDEPENDENT_AMBULATORY_CARE_PROVIDER_SITE_OTHER): Payer: Managed Care, Other (non HMO) | Admitting: *Deleted

## 2020-09-22 DIAGNOSIS — Z3480 Encounter for supervision of other normal pregnancy, unspecified trimester: Secondary | ICD-10-CM

## 2020-09-22 DIAGNOSIS — Z36 Encounter for antenatal screening for chromosomal anomalies: Secondary | ICD-10-CM | POA: Diagnosis not present

## 2020-09-22 NOTE — Progress Notes (Signed)
Pt here for NIPS lab only

## 2020-09-28 ENCOUNTER — Other Ambulatory Visit: Payer: Self-pay | Admitting: Certified Nurse Midwife

## 2020-09-28 ENCOUNTER — Other Ambulatory Visit: Payer: Self-pay

## 2020-09-28 DIAGNOSIS — O210 Mild hyperemesis gravidarum: Secondary | ICD-10-CM

## 2020-09-28 MED ORDER — FAMOTIDINE 20 MG PO TABS: 20.0000 mg | ORAL_TABLET | Freq: Every day | ORAL | 0 refills | Status: DC

## 2020-09-28 NOTE — Telephone Encounter (Signed)
Pt called requesting refill of Pepcid. Refill sent.

## 2020-10-07 ENCOUNTER — Ambulatory Visit (INDEPENDENT_AMBULATORY_CARE_PROVIDER_SITE_OTHER): Payer: Managed Care, Other (non HMO) | Admitting: Certified Nurse Midwife

## 2020-10-07 ENCOUNTER — Other Ambulatory Visit: Payer: Self-pay

## 2020-10-07 ENCOUNTER — Other Ambulatory Visit (HOSPITAL_COMMUNITY)
Admission: RE | Admit: 2020-10-07 | Discharge: 2020-10-07 | Disposition: A | Discharge status: Home / Self Care | Payer: Managed Care, Other (non HMO) | Source: Ambulatory Visit | Attending: Certified Nurse Midwife | Admitting: Certified Nurse Midwife

## 2020-10-07 VITALS — BP 104/74 | HR 84 | Wt 158.0 lb

## 2020-10-07 DIAGNOSIS — B373 Candidiasis of vulva and vagina: Secondary | ICD-10-CM

## 2020-10-07 DIAGNOSIS — Z3A14 14 weeks gestation of pregnancy: Secondary | ICD-10-CM

## 2020-10-07 DIAGNOSIS — Z3480 Encounter for supervision of other normal pregnancy, unspecified trimester: Secondary | ICD-10-CM

## 2020-10-07 DIAGNOSIS — O26892 Other specified pregnancy related conditions, second trimester: Secondary | ICD-10-CM | POA: Diagnosis not present

## 2020-10-07 DIAGNOSIS — N898 Other specified noninflammatory disorders of vagina: Secondary | ICD-10-CM

## 2020-10-07 DIAGNOSIS — B3731 Acute candidiasis of vulva and vagina: Secondary | ICD-10-CM

## 2020-10-07 NOTE — Progress Notes (Signed)
Subjective:  Donna Huang is a 20 y.o. G3P1011 at [redacted]w[redacted]d being seen today for ongoing prenatal care.  She is currently monitored for the following issues for this low-risk pregnancy and has Patellar dislocation; Attention deficit disorder (ADD) without hyperactivity; Dysmenorrhea; Supervision of other normal pregnancy, antepartum; Short interval between pregnancies affecting pregnancy, antepartum; Rh negative status during pregnancy; and Vitamin D deficiency on their problem list.  Patient reports  yellow vaginal discharge, itching, and malodor . She tried Monistat which helped for a short time then sx returned. Morning sickness is better with Pepcid and Diclegis. Contractions: Not present. Vag. Bleeding: None.  Movement: Absent. Denies leaking of fluid.   The following portions of the patient's history were reviewed and updated as appropriate: allergies, current medications, past family history, past medical history, past social history, past surgical history and problem list. Problem list updated.  Objective:   Vitals:   10/07/20 0854  BP: 104/74  Pulse: 84  Weight: 158 lb (71.7 kg)    Fetal Status: Fetal Heart Rate (bpm): 147 Fundal Height: 14 cm Movement: Absent     General:  Alert, oriented and cooperative. Patient is in no acute distress.  Skin: Skin is warm and dry. No rash noted.   Cardiovascular: Normal heart rate noted  Respiratory: Normal respiratory effort, no problems with respiration noted  Abdomen: Soft, gravid, appropriate for gestational age. Pain/Pressure: Absent     Pelvic: Vag. Bleeding: None Vag D/C Character: Thin          Extremities: Normal range of motion.  Edema: None  Mental Status: Normal mood and affect. Normal behavior. Normal judgment and thought content.   Urinalysis:      Assessment and Plan:  Pregnancy: G3P1011 at [redacted]w[redacted]d  1. Supervision of other normal pregnancy, antepartum - [place] MFM OB COMP + 14 WK; Future  2. [redacted] weeks gestation of  pregnancy  3. Vaginal itching - Cervicovaginal ancillary only( [place])  Preterm labor symptoms and general obstetric precautions including but not limited to vaginal bleeding, contractions, leaking of fluid and fetal movement were reviewed in detail with the patient. Please refer to After Visit Summary for other counseling recommendations.  Return in about 5 weeks (around 11/11/2020).   Donette Larry, CNM

## 2020-10-09 LAB — CERVICOVAGINAL ANCILLARY ONLY
Bacterial Vaginitis (gardnerella): NEGATIVE
Candida Glabrata: NEGATIVE
Candida Vaginitis: POSITIVE — AB
Comment: NEGATIVE
Comment: NEGATIVE
Comment: NEGATIVE

## 2020-10-12 MED ORDER — TERCONAZOLE 0.4 % VA CREA: 1.0000 | TOPICAL_CREAM | Freq: Every day | VAGINAL | 0 refills | Status: DC

## 2020-10-12 NOTE — Addendum Note (Signed)
Addended by: Donette Larry E on: 10/12/2020 09:52 AM   Modules accepted: Orders

## 2020-11-03 ENCOUNTER — Ambulatory Visit: Payer: Managed Care, Other (non HMO) | Attending: Certified Nurse Midwife

## 2020-11-03 ENCOUNTER — Other Ambulatory Visit: Payer: Self-pay

## 2020-11-03 ENCOUNTER — Other Ambulatory Visit: Payer: Self-pay | Admitting: *Deleted

## 2020-11-03 DIAGNOSIS — Z3480 Encounter for supervision of other normal pregnancy, unspecified trimester: Secondary | ICD-10-CM | POA: Insufficient documentation

## 2020-11-03 DIAGNOSIS — Z362 Encounter for other antenatal screening follow-up: Secondary | ICD-10-CM

## 2020-11-11 ENCOUNTER — Other Ambulatory Visit: Payer: Self-pay

## 2020-11-11 ENCOUNTER — Ambulatory Visit (INDEPENDENT_AMBULATORY_CARE_PROVIDER_SITE_OTHER): Payer: Managed Care, Other (non HMO) | Admitting: Certified Nurse Midwife

## 2020-11-11 VITALS — BP 97/54 | HR 84 | Wt 163.0 lb

## 2020-11-11 DIAGNOSIS — Z3480 Encounter for supervision of other normal pregnancy, unspecified trimester: Secondary | ICD-10-CM

## 2020-11-11 DIAGNOSIS — Z3A19 19 weeks gestation of pregnancy: Secondary | ICD-10-CM

## 2020-11-11 DIAGNOSIS — E559 Vitamin D deficiency, unspecified: Secondary | ICD-10-CM

## 2020-11-11 DIAGNOSIS — Z3482 Encounter for supervision of other normal pregnancy, second trimester: Secondary | ICD-10-CM

## 2020-11-11 NOTE — Progress Notes (Signed)
Subjective:  Donna Huang is a 20 y.o. G3P1011 at [redacted]w[redacted]d being seen today for ongoing prenatal care.  She is currently monitored for the following issues for this low-risk pregnancy and has Patellar dislocation; Attention deficit disorder (ADD) without hyperactivity; Dysmenorrhea; Supervision of other normal pregnancy, antepartum; Short interval between pregnancies affecting pregnancy, antepartum; Rh negative status during pregnancy; and Vitamin D deficiency on their problem list.  Patient reports headache. HA usually at night before bed. Located over right eye. Uses ice which helps. Tylenol didn't work. Contractions: Not present. Vag. Bleeding: None.  Movement: Present. Denies leaking of fluid.   The following portions of the patient's history were reviewed and updated as appropriate: allergies, current medications, past family history, past medical history, past social history, past surgical history and problem list. Problem list updated.  Objective:   Vitals:   11/11/20 0842  BP: (!) 97/54  Pulse: 84  Weight: 163 lb (73.9 kg)    Fetal Status: Fetal Heart Rate (bpm): 143   Movement: Present     General:  Alert, oriented and cooperative. Patient is in no acute distress.  Skin: Skin is warm and dry. No rash noted.   Cardiovascular: Normal heart rate noted  Respiratory: Normal respiratory effort, no problems with respiration noted  Abdomen: Soft, gravid, appropriate for gestational age. Pain/Pressure: Absent     Pelvic: Vag. Bleeding: None Vag D/C Character: Thin   Cervical exam deferred        Extremities: Normal range of motion.  Edema: None  Mental Status: Normal mood and affect. Normal behavior. Normal judgment and thought content.   Urinalysis:      Assessment and Plan:  Pregnancy: G3P1011 at [redacted]w[redacted]d  1. Encounter for supervision of other normal pregnancy in second trimester - Alpha fetoprotein, maternal  2. [redacted] weeks gestation  3. Headache - continue ice packs - increase  water intake - can add Mg 200-400 daily prn - notify provider if worsening or these measures not helping  4. Vitamin D deficiency - Vit-D, serum  Preterm labor symptoms and general obstetric precautions including but not limited to vaginal bleeding, contractions, leaking of fluid and fetal movement were reviewed in detail with the patient. Please refer to After Visit Summary for other counseling recommendations.  Return in about 5 weeks (around 12/16/2020).   Donette Larry, CNM

## 2020-11-12 LAB — VITAMIN D 25 HYDROXY (VIT D DEFICIENCY, FRACTURES): Vit D, 25-Hydroxy: 33 ng/mL (ref 30–100)

## 2020-11-14 LAB — ALPHA FETOPROTEIN, MATERNAL
AFP MoM: 0.74
AFP, Serum: 39.6 ng/mL
Calc'd Gestational Age: 19.7 weeks
Maternal Wt: 163 [lb_av]
Risk for ONTD: <1
Twins-AFP: 1

## 2020-11-26 ENCOUNTER — Telehealth: Payer: Managed Care, Other (non HMO) | Admitting: Nurse Practitioner

## 2020-11-26 DIAGNOSIS — U071 COVID-19: Secondary | ICD-10-CM

## 2020-11-26 DIAGNOSIS — Z3490 Encounter for supervision of normal pregnancy, unspecified, unspecified trimester: Secondary | ICD-10-CM

## 2020-11-26 MED ORDER — BENZONATATE 100 MG PO CAPS: 100.0000 mg | ORAL_CAPSULE | Freq: Three times a day (TID) | ORAL | 0 refills | Status: DC | PRN

## 2020-11-26 MED ORDER — NAPROXEN 500 MG PO TABS: 500.0000 mg | ORAL_TABLET | Freq: Two times a day (BID) | ORAL | 0 refills | Status: DC

## 2020-11-26 NOTE — Progress Notes (Signed)
E-Visit  for Positive Covid Test Result  We are sorry you are not feeling well. We are here to help!  You have tested positive for COVID-19, meaning that you were infected with the novel coronavirus and could give the virus to others.  It is vitally important that you stay home so you do not spread it to others.    Since you are pregnant all you can do is take robitussinOTC fr cough and tylenol for body aches and  fever. If become SOB or worsen neede to go to the ED.    Please continue isolation at home, for at least 10 days since the start of your symptoms and until you have had 24 hours with no fever (without taking a fever reducer) and with improving of symptoms.  If you have no symptoms but tested positive (or all symptoms resolve after 5 days and you have no fever) you can leave your house but continue to wear a mask around others for an additional 5 days. If you have a fever,continue to stay home until you have had 24 hours of no fever. Most cases improve 5-10 days from onset but we have seen a small number of patients who have gotten worse after the 10 days.  Please be sure to watch for worsening symptoms and remain taking the proper precautions.   Go to the nearest hospital ED for assessment if fever/cough/breathlessness are severe or illness seems like a threat to life.    The following symptoms may appear 2-14 days after exposure: Fever Cough Shortness of breath or difficulty breathing Chills Repeated shaking with chills Muscle pain Headache Sore throat New loss of taste or smell Fatigue Congestion or runny nose Nausea or vomiting Diarrhea  You have been enrolled in MyChart Home Monitoring for COVID-19. Daily you will receive a questionnaire within the MyChart website. Our COVID-19 response team will be monitoring your responses daily.    You may also take acetaminophen (Tylenol) as needed for fever.  HOME CARE: Only take medications as instructed by your medical  team. Drink plenty of fluids and get plenty of rest. A steam or ultrasonic humidifier can help if you have congestion.   GET HELP RIGHT AWAY IF YOU HAVE EMERGENCY WARNING SIGNS.  Call 911 or proceed to your closest emergency facility if: You develop worsening high fever. Trouble breathing Bluish lips or face Persistent pain or pressure in the chest New confusion Inability to wake or stay awake You cough up blood. Your symptoms become more severe Inability to hold down food or fluids  This list is not all possible symptoms. Contact your medical provider for any symptoms that are severe or concerning to you.    Your e-visit answers were reviewed by a board certified advanced clinical practitioner to complete your personal care plan.  Depending on the condition, your plan could have included both over the counter or prescription medications.  If there is a problem please reply once you have received a response from your provider.  Your safety is important to [place].  If you have drug allergies check your prescription carefully.    You can use MyChart to ask questions about today's visit, request a non-urgent call back, or ask for a work or school excuse for 24 hours related to this e-Visit. If it has been greater than 24 hours you will need to follow up with your provider, or enter a new e-Visit to address those concerns. You will get an e-mail in  the next two days asking about your experience.  I hope that your e-visit has been valuable and will speed your recovery. Thank you for using e-visits.  5-10 minutes spent reviewing and documenting in chart.

## 2020-11-28 ENCOUNTER — Other Ambulatory Visit: Payer: Self-pay | Admitting: Certified Nurse Midwife

## 2020-11-28 DIAGNOSIS — O210 Mild hyperemesis gravidarum: Secondary | ICD-10-CM

## 2020-11-30 ENCOUNTER — Telehealth: Payer: Self-pay

## 2020-11-30 DIAGNOSIS — O210 Mild hyperemesis gravidarum: Secondary | ICD-10-CM

## 2020-11-30 MED ORDER — FAMOTIDINE 20 MG PO TABS: 20.0000 mg | ORAL_TABLET | Freq: Every day | ORAL | 0 refills | Status: DC

## 2020-11-30 NOTE — Telephone Encounter (Signed)
Pt requesting refill of Pepcid. Refill sent.

## 2020-12-01 ENCOUNTER — Ambulatory Visit: Payer: Managed Care, Other (non HMO)

## 2020-12-14 ENCOUNTER — Ambulatory Visit: Payer: Managed Care, Other (non HMO) | Attending: Obstetrics

## 2020-12-14 ENCOUNTER — Other Ambulatory Visit: Payer: Self-pay

## 2020-12-14 DIAGNOSIS — Z362 Encounter for other antenatal screening follow-up: Secondary | ICD-10-CM | POA: Diagnosis not present

## 2020-12-14 DIAGNOSIS — Z3A24 24 weeks gestation of pregnancy: Secondary | ICD-10-CM | POA: Diagnosis not present

## 2020-12-15 ENCOUNTER — Other Ambulatory Visit: Payer: Self-pay | Admitting: *Deleted

## 2020-12-15 DIAGNOSIS — Z3689 Encounter for other specified antenatal screening: Secondary | ICD-10-CM

## 2020-12-19 ENCOUNTER — Ambulatory Visit (INDEPENDENT_AMBULATORY_CARE_PROVIDER_SITE_OTHER): Payer: Managed Care, Other (non HMO) | Admitting: Family Medicine

## 2020-12-19 ENCOUNTER — Other Ambulatory Visit: Payer: Self-pay

## 2020-12-19 VITALS — BP 88/58 | HR 75 | Wt 170.0 lb

## 2020-12-19 DIAGNOSIS — Z3480 Encounter for supervision of other normal pregnancy, unspecified trimester: Secondary | ICD-10-CM

## 2020-12-19 DIAGNOSIS — Z3A25 25 weeks gestation of pregnancy: Secondary | ICD-10-CM

## 2020-12-19 DIAGNOSIS — O26892 Other specified pregnancy related conditions, second trimester: Secondary | ICD-10-CM

## 2020-12-19 DIAGNOSIS — O09899 Supervision of other high risk pregnancies, unspecified trimester: Secondary | ICD-10-CM

## 2020-12-19 DIAGNOSIS — Z6791 Unspecified blood type, Rh negative: Secondary | ICD-10-CM

## 2020-12-19 NOTE — Progress Notes (Signed)
   PRENATAL VISIT NOTE  Subjective:  Donna Huang is a 20 y.o. G3P1011 at [redacted]w[redacted]d being seen today for ongoing prenatal care.  She is currently monitored for the following issues for this low-risk pregnancy and has Patellar dislocation; Attention deficit disorder (ADD) without hyperactivity; Dysmenorrhea; Supervision of other normal pregnancy, antepartum; Short interval between pregnancies affecting pregnancy, antepartum; Rh negative status during pregnancy; and Vitamin D deficiency on their problem list.  Patient reports no complaints.  Contractions: Not present. Vag. Bleeding: None.  Movement: Present. Denies leaking of fluid.   The following portions of the patient's history were reviewed and updated as appropriate: allergies, current medications, past family history, past medical history, past social history, past surgical history and problem list.   Objective:   Vitals:   12/19/20 0946  BP: (!) 88/58  Pulse: 75  Weight: 170 lb (77.1 kg)    Fetal Status: Fetal Heart Rate (bpm): 143 Fundal Height: 25 cm Movement: Present     General:  Alert, oriented and cooperative. Patient is in no acute distress.  Skin: Skin is warm and dry. No rash noted.   Cardiovascular: Normal heart rate noted  Respiratory: Normal respiratory effort, no problems with respiration noted  Abdomen: Soft, gravid, appropriate for gestational age.  Pain/Pressure: Absent     Pelvic: Cervical exam deferred        Extremities: Normal range of motion.  Edema: None  Mental Status: Normal mood and affect. Normal behavior. Normal judgment and thought content.   Assessment and Plan:  Pregnancy: G3P1011 at [redacted]w[redacted]d 1. Supervision of other normal pregnancy, antepartum F/u anatomy showed baby at 15%--and she will need f/u in 4 weeks. Discussed at length and we will follow--she has good FM  2. Rh negative status during pregnancy in second trimester Will need Rhogam at next visit.  3. Short interval between pregnancies  affecting pregnancy, antepartum   Preterm labor symptoms and general obstetric precautions including but not limited to vaginal bleeding, contractions, leaking of fluid and fetal movement were reviewed in detail with the patient. Please refer to After Visit Summary for other counseling recommendations.   Return in 3 weeks (on 01/09/2021) for [facility], 28 wk labs.  Future Appointments  Date Time Provider Department Center  01/13/2021  8:15 AM [facility] NURSE [facility]  01/13/2021  8:30 AM [facility] US3 [facility]    Reva Bores, MD

## 2020-12-19 NOTE — Patient Instructions (Signed)

## 2020-12-28 ENCOUNTER — Other Ambulatory Visit: Payer: Self-pay

## 2020-12-28 ENCOUNTER — Encounter (HOSPITAL_COMMUNITY): Payer: Self-pay | Admitting: Obstetrics & Gynecology

## 2020-12-28 ENCOUNTER — Telehealth: Payer: Self-pay

## 2020-12-28 ENCOUNTER — Inpatient Hospital Stay (HOSPITAL_COMMUNITY)
Admission: AD | Admit: 2020-12-28 | Discharge: 2020-12-28 | Disposition: A | Discharge status: Home / Self Care | Payer: Managed Care, Other (non HMO) | Attending: Obstetrics & Gynecology | Admitting: Obstetrics & Gynecology

## 2020-12-28 DIAGNOSIS — Z3A26 26 weeks gestation of pregnancy: Secondary | ICD-10-CM | POA: Insufficient documentation

## 2020-12-28 DIAGNOSIS — R102 Pelvic and perineal pain: Secondary | ICD-10-CM | POA: Diagnosis not present

## 2020-12-28 DIAGNOSIS — N898 Other specified noninflammatory disorders of vagina: Secondary | ICD-10-CM

## 2020-12-28 DIAGNOSIS — O2312 Infections of bladder in pregnancy, second trimester: Secondary | ICD-10-CM | POA: Diagnosis not present

## 2020-12-28 DIAGNOSIS — Z6791 Unspecified blood type, Rh negative: Secondary | ICD-10-CM

## 2020-12-28 DIAGNOSIS — R8290 Unspecified abnormal findings in urine: Secondary | ICD-10-CM

## 2020-12-28 DIAGNOSIS — O09899 Supervision of other high risk pregnancies, unspecified trimester: Secondary | ICD-10-CM

## 2020-12-28 DIAGNOSIS — N3090 Cystitis, unspecified without hematuria: Secondary | ICD-10-CM | POA: Diagnosis not present

## 2020-12-28 DIAGNOSIS — N3000 Acute cystitis without hematuria: Secondary | ICD-10-CM

## 2020-12-28 DIAGNOSIS — R109 Unspecified abdominal pain: Secondary | ICD-10-CM | POA: Diagnosis present

## 2020-12-28 DIAGNOSIS — E559 Vitamin D deficiency, unspecified: Secondary | ICD-10-CM

## 2020-12-28 DIAGNOSIS — O26892 Other specified pregnancy related conditions, second trimester: Secondary | ICD-10-CM

## 2020-12-28 LAB — URINALYSIS, ROUTINE W REFLEX MICROSCOPIC
Bilirubin Urine: NEGATIVE
Glucose, UA: NEGATIVE mg/dL
Hgb urine dipstick: NEGATIVE
Ketones, ur: NEGATIVE mg/dL
Nitrite: NEGATIVE
Protein, ur: NEGATIVE mg/dL
Specific Gravity, Urine: 1.003 — ABNORMAL LOW (ref 1.005–1.030)
pH: 6 (ref 5.0–8.0)

## 2020-12-28 LAB — WET PREP, GENITAL
Clue Cells Wet Prep HPF POC: NONE SEEN
Sperm: NONE SEEN
Trich, Wet Prep: NONE SEEN
Yeast Wet Prep HPF POC: NONE SEEN

## 2020-12-28 MED ORDER — CEFADROXIL 500 MG PO CAPS: 500.0000 mg | ORAL_CAPSULE | Freq: Two times a day (BID) | ORAL | 0 refills | Status: AC

## 2020-12-28 NOTE — Discharge Instructions (Signed)
You came into the MAU because you had stomach cramping/pain off and on for the past week and also some pressure in your pelvis. We watched your baby and for contractions on the monitor and your baby looked good and you did not have any contractions. We also did a speculum exam which did not show any blood in your vaginal. We checked your cervix and it was 0.5 cm open and when we rechecked in 1 hr it continued to be the same. During that hour there were no contractions on the monitor. Based on those findings we do not think you are in preterm labor.  Please seek medical care if you have any vaginal bleeding, gush of fluid, the pressure in the pelvis intensifies, or you have abdominal cramping that is painful and coming regularly every 5 -10 minutes and goes on for 1 hr.    We also checked your urine and you had some bacteria in it so we will treat you for a urinary tract infection.

## 2020-12-28 NOTE — MAU Provider Note (Signed)
History     CSN: [id number]  Arrival date and time: 12/28/20 1313   Event Date/Time   First Provider Initiated Contact with Patient 12/28/20 1420      Chief Complaint  Patient presents with   Abdominal Pain   pelvic pressure   HPI 20 yo G3P1011 at [redacted]w[redacted]d who presents with 1 weeks of intermittent abdominal cramping (last episode this morning ~4 hours ago) and also constant pelvic pressure. Denies vaginal bleeding and denies leaking of fluid.  #Abdominal pressure - all over abdomen - currently no pain - last pain before coming into MAU today - feels like pain prior to bowel movement but lasts after BM - not  tried anything for pain - no N/V at this time  #pelvic pressure - denies vaginal discharge - no bleeding in pregnancy - had [place] on 9/14 with cervical length of 3.09 cm   OB History     Gravida  3   Para  1   Term  1   Preterm      AB  1   Living  1      SAB  1   IAB      Ectopic      Multiple  0   Live Births  1           Past Medical History:  Diagnosis Date   ADD (attention deficit disorder)    ADHD (attention deficit hyperactivity disorder)     Past Surgical History:  Procedure Laterality Date   KNEE SURGERY Left    TONSILLECTOMY      History reviewed. No pertinent family history.  Social History   Tobacco Use   Smoking status: Never   Smokeless tobacco: Never  Vaping Use   Vaping Use: Never used  Substance Use Topics   Alcohol use: No   Drug use: No    Allergies: No Known Allergies  Medications Prior to Admission  Medication Sig Dispense Refill Last Dose   Doxylamine-Pyridoxine 10-10 MG TBEC Take 2 tablets by mouth at bedtime. May also take 1 tab in am and 1 tab in afternoon 100 tablet 1 12/27/2020   famotidine (PEPCID) 20 MG tablet TAKE 1 TABLET BY MOUTH AT BEDTIME 30 tablet 0 12/27/2020   Prenatal Vit-Fe Fumarate-FA (PRENATAL VITAMINS PO) Take 1 capsule by mouth daily.   12/27/2020   promethazine (PHENERGAN) 25 MG  tablet Take 1 tablet (25 mg total) by mouth every 6 (six) hours as needed for nausea or vomiting. 30 tablet 1 Unknown    Review of Systems  Constitutional:  Negative for chills and fever.  Respiratory:  Negative for shortness of breath.   Cardiovascular:  Negative for chest pain.  Gastrointestinal:  Negative for abdominal pain, blood in stool, constipation, diarrhea, nausea and vomiting.       Had abdominal cramping earlier today but not while in MAU  Endocrine: Negative for polyuria.  Genitourinary:  Negative for dysuria and hematuria.  Musculoskeletal:  Negative for back pain.  Skin:  Negative for rash.  Neurological:  Negative for headaches.  Psychiatric/Behavioral:  Negative for agitation.   Physical Exam   Blood pressure 116/67, pulse 94, temperature 98.4 F (36.9 C), temperature source Oral, resp. rate 16, height 5' 2.5" (1.588 m), weight 171 lb 14.4 oz (78 kg), last menstrual period 06/17/2020, SpO2 100 %, unknown if currently breastfeeding.  Physical Exam Vitals and nursing note reviewed.  Constitutional:      Comments: No acute distress  HENT:  Head: Normocephalic.     Mouth/Throat:     Mouth: Mucous membranes are moist.  Cardiovascular:     Rate and Rhythm: Normal rate.  Pulmonary:     Effort: Pulmonary effort is normal.  Abdominal:     General: Bowel sounds are normal.     Palpations: Abdomen is soft.     Tenderness: There is no abdominal tenderness. There is no right CVA tenderness or left CVA tenderness.     Comments: Gravid, no TTP  Genitourinary:    Comments:  On speculum exam no evidence of bulging membranes or fetal parts or bleeding Initial cervical exam 0.5/thick/-4, repeat exam 1 hr later unchanged Neurological:     Mental Status: She is alert.    MAU Course  Procedures NST done: Reactive with baseline of 140s, + accels, no decels. No contractions on monitor   MDM 20 yo G3P1011 at [redacted]w[redacted]d who presents with 1 weeks of intermittent abdominal  cramping (last episode this morning ~4 hours ago) and also constant pelvic pressure. On speculum exam no evidence of bulging membranes or fetal parts or bleeding. NST reactive. No change on cervical exam between checks. At this time no evidence of preterm labor or preterm premature rupture.   Assessment and Plan   Abdominal pressure Pelvic pressure Presents with 1 week of intermittent abdominal cramping (last episode this morning ~4 hours ago) and also constant pelvic pressure. On speculum exam no evidence of bulging membranes or fetal parts or bleeding. Visually cervix appears closed to -0.5 cm. NST reactive swab.  Denies vaginal discharge and bleeding. No contractions on monitor. - GC/CT - speculum exam done - cervix checked was 0.5cm/thick/high and remained unchanged on exam - wet prep done - UA done   UA findings hazy with leuks and bacteria.  Potentially explains abdominal pain. No evidence of CVA tenderness and vital signs stable. Will treat as cystitis with Cefadroxil (sent to patient's pharmacy).  -Urine culture not sent initially but reached out to micro lab to add on after patient left and they were able to add on.  On re-eval: Continues to have no abdominal discomfort at this time. No change on cervical exam between checks that were 1 hr apart. At this time no evidence of preterm labor or preterm premature rupture. Discharged home with return precautions discussed in detail.   Warner Mccreedy, MD, MPH OB Fellow, Faculty Practice

## 2020-12-28 NOTE — Telephone Encounter (Signed)
Pt is [redacted]w[redacted]d pregnant calling the office with concerns of increased pelvic pressure and an increase in contractions. There is no provider in [place] office today. I spoke with [place] office since they do have a provider today to see if they had availability to see pt in office and they recommend pt go to MAU instead of come into office. Pt is aware of recommendation to go to MAU. Pt expressed understanding.

## 2020-12-28 NOTE — MAU Note (Addendum)
Been feeling pressure down there for a couple days.  Stomach has just been feeling weird and cramping.   Denies ROM or bleeding. Denies constipation or diarrhea.

## 2020-12-29 LAB — GC/CHLAMYDIA PROBE AMP (~~LOC~~) NOT AT ARMC
Chlamydia: NEGATIVE
Comment: NEGATIVE
Comment: NORMAL
Neisseria Gonorrhea: NEGATIVE

## 2020-12-29 LAB — URINE CULTURE: Culture: <10000 — AB

## 2021-01-02 ENCOUNTER — Telehealth: Payer: Self-pay

## 2021-01-02 NOTE — Telephone Encounter (Signed)
Returned pt call. Pt was seen in MAU on 12/28/20 and is concerned because they told her she was 0.5 cm dilated but, there was no change after an hour so they weren't concerned. Pt states she still has the same pelvic pressure she had when she went to MAU. Pt states the pressure hasn't worsened but, it is still there. Pt did pick up Rx that was sent for UTI and started taking it on Saturday 10/1. Pt has appt in office on 10/6. I told pt to continue to take meds because a UTI can add to that pressure feeling and that if pressure increases to let us know. Pt was also offered an appt today or tomorrow to come in earlier for reassurance. Pt declined earlier appt and wants to wait to be seen on 10/6. Pt was told to call us if anything changes or worsens.

## 2021-01-08 NOTE — Progress Notes (Signed)
   PRENATAL VISIT NOTE  Subjective:  Donna Huang is a 20 y.o. G3P1011 at [redacted]w[redacted]d being seen today for ongoing prenatal care.  She is currently monitored for the following issues for this low-risk pregnancy and has Patellar dislocation; Attention deficit disorder (ADD) without hyperactivity; Dysmenorrhea; Supervision of other normal pregnancy, antepartum; Short interval between pregnancies affecting pregnancy, antepartum; Rh negative status during pregnancy; and Vitamin D deficiency on their problem list.  Patient reports  see below .  Contractions: Irritability. Vag. Bleeding: None.  Movement: Present. Denies leaking of fluid.   She was seen in MAU on 9/28 for pressure. CE was 0.5 cm but no change. S/p ABX for possible uti. Ucx neg. Wet prep neg.  Pressure continues today but no worse  The following portions of the patient's history were reviewed and updated as appropriate: allergies, current medications, past family history, past medical history, past social history, past surgical history and problem list.   Objective:   Vitals:   01/12/21 0805  BP: 98/66  Pulse: 93  Weight: 176 lb (79.8 kg)    Fetal Status: Fetal Heart Rate (bpm): 143 Fundal Height: 28 cm Movement: Present     General:  Alert, oriented and cooperative. Patient is in no acute distress.  Skin: Skin is warm and dry. No rash noted.   Cardiovascular: Normal heart rate noted  Respiratory: Normal respiratory effort, no problems with respiration noted  Abdomen: Soft, gravid, appropriate for gestational age.  Pain/Pressure: Present     Pelvic: Cervical exam performed in the presence of a chaperone Dilation: Fingertip Effacement (%): Thick Station: Ballotable  Extremities: Normal range of motion.  Edema: None  Mental Status: Normal mood and affect. Normal behavior. Normal judgment and thought content.   Assessment and Plan:  Pregnancy: G3P1011 at [redacted]w[redacted]d 1. Supervision of other normal pregnancy, antepartum - Offered flu  shot and tdap- pt accepts - f/u anatomy US done on 9/14 and growth was 15%ile. Plan is for f/u on 10/14.   2. Short interval between pregnancies affecting pregnancy, antepartum - Reviewed risk of PTB and SGA. Has growth 10/14.  - No cervical change or change in symptoms - precautions discussed.   3. Rh negative status during pregnancy in third trimester - Rhogam indicated and given  Preterm labor symptoms and general obstetric precautions including but not limited to vaginal bleeding, contractions, leaking of fluid and fetal movement were reviewed in detail with the patient. Please refer to After Visit Summary for other counseling recommendations.   Return in about 2 weeks (around 01/26/2021) for OB VISIT, MD or APP.  Future Appointments  Date Time Provider Department Center  01/13/2021  8:15 AM [facility] NURSE [facility]  01/13/2021  8:30 AM [facility] US3 [facility]    Milas Hock, MD

## 2021-01-12 ENCOUNTER — Ambulatory Visit (INDEPENDENT_AMBULATORY_CARE_PROVIDER_SITE_OTHER): Payer: Managed Care, Other (non HMO) | Admitting: Obstetrics and Gynecology

## 2021-01-12 ENCOUNTER — Encounter: Payer: Self-pay | Admitting: Obstetrics and Gynecology

## 2021-01-12 ENCOUNTER — Other Ambulatory Visit: Payer: Self-pay

## 2021-01-12 VITALS — BP 98/66 | HR 93 | Wt 176.0 lb

## 2021-01-12 DIAGNOSIS — Z23 Encounter for immunization: Secondary | ICD-10-CM

## 2021-01-12 DIAGNOSIS — Z3480 Encounter for supervision of other normal pregnancy, unspecified trimester: Secondary | ICD-10-CM | POA: Diagnosis not present

## 2021-01-12 DIAGNOSIS — O26893 Other specified pregnancy related conditions, third trimester: Secondary | ICD-10-CM

## 2021-01-12 DIAGNOSIS — Z6791 Unspecified blood type, Rh negative: Secondary | ICD-10-CM

## 2021-01-12 DIAGNOSIS — O09899 Supervision of other high risk pregnancies, unspecified trimester: Secondary | ICD-10-CM

## 2021-01-12 MED ORDER — RHO D IMMUNE GLOBULIN 1500 UNIT/2ML IJ SOSY
300.0000 ug | PREFILLED_SYRINGE | Freq: Once | INTRAMUSCULAR | Status: AC
  Administered 2021-01-12: 300 ug via INTRAMUSCULAR

## 2021-01-13 ENCOUNTER — Ambulatory Visit: Payer: Managed Care, Other (non HMO) | Attending: Obstetrics

## 2021-01-13 ENCOUNTER — Ambulatory Visit: Payer: Managed Care, Other (non HMO) | Admitting: *Deleted

## 2021-01-13 ENCOUNTER — Encounter: Payer: Self-pay | Admitting: *Deleted

## 2021-01-13 ENCOUNTER — Other Ambulatory Visit: Payer: Self-pay | Admitting: *Deleted

## 2021-01-13 VITALS — BP 111/61 | HR 98

## 2021-01-13 DIAGNOSIS — O26893 Other specified pregnancy related conditions, third trimester: Secondary | ICD-10-CM | POA: Diagnosis present

## 2021-01-13 DIAGNOSIS — Z3A28 28 weeks gestation of pregnancy: Secondary | ICD-10-CM

## 2021-01-13 DIAGNOSIS — Z6791 Unspecified blood type, Rh negative: Secondary | ICD-10-CM | POA: Diagnosis present

## 2021-01-13 DIAGNOSIS — Z3689 Encounter for other specified antenatal screening: Secondary | ICD-10-CM | POA: Insufficient documentation

## 2021-01-13 DIAGNOSIS — O26849 Uterine size-date discrepancy, unspecified trimester: Secondary | ICD-10-CM

## 2021-01-13 DIAGNOSIS — O09899 Supervision of other high risk pregnancies, unspecified trimester: Secondary | ICD-10-CM | POA: Diagnosis present

## 2021-01-13 DIAGNOSIS — Z362 Encounter for other antenatal screening follow-up: Secondary | ICD-10-CM

## 2021-01-13 DIAGNOSIS — E559 Vitamin D deficiency, unspecified: Secondary | ICD-10-CM | POA: Diagnosis present

## 2021-01-13 LAB — CBC
HCT: 33.9 % — ABNORMAL LOW (ref 35.0–45.0)
Hemoglobin: 11.6 g/dL — ABNORMAL LOW (ref 11.7–15.5)
MCH: 33.3 pg — ABNORMAL HIGH (ref 27.0–33.0)
MCHC: 34.2 g/dL (ref 32.0–36.0)
MCV: 97.4 fL (ref 80.0–100.0)
MPV: 11.6 fL (ref 7.5–12.5)
Platelets: 186 10*3/uL (ref 140–400)
RBC: 3.48 10*6/uL — ABNORMAL LOW (ref 3.80–5.10)
RDW: 11.6 % (ref 11.0–15.0)
WBC: 9.1 10*3/uL (ref 3.8–10.8)

## 2021-01-13 LAB — HIV ANTIBODY (ROUTINE TESTING W REFLEX): HIV 1&2 Ab, 4th Generation: NONREACTIVE

## 2021-01-13 LAB — 2HR GTT W 1 HR, CARPENTER, 75 G
Glucose, 1 Hr, Gest: 97 mg/dL (ref 65–179)
Glucose, 2 Hr, Gest: 85 mg/dL (ref 65–152)
Glucose, Fasting, Gest: 80 mg/dL (ref 65–91)

## 2021-01-13 LAB — RPR: RPR Ser Ql: NONREACTIVE

## 2021-01-13 LAB — ANTIBODY SCREEN: Antibody Screen: NOT DETECTED

## 2021-01-26 ENCOUNTER — Ambulatory Visit (INDEPENDENT_AMBULATORY_CARE_PROVIDER_SITE_OTHER): Payer: Managed Care, Other (non HMO) | Admitting: Obstetrics & Gynecology

## 2021-01-26 ENCOUNTER — Other Ambulatory Visit: Payer: Self-pay

## 2021-01-26 ENCOUNTER — Other Ambulatory Visit (HOSPITAL_COMMUNITY)
Admission: RE | Admit: 2021-01-26 | Discharge: 2021-01-26 | Disposition: A | Discharge status: Home / Self Care | Payer: Managed Care, Other (non HMO) | Source: Ambulatory Visit | Attending: Obstetrics & Gynecology | Admitting: Obstetrics & Gynecology

## 2021-01-26 VITALS — BP 97/58 | HR 89 | Wt 180.0 lb

## 2021-01-26 DIAGNOSIS — N898 Other specified noninflammatory disorders of vagina: Secondary | ICD-10-CM | POA: Diagnosis present

## 2021-01-26 DIAGNOSIS — Z3483 Encounter for supervision of other normal pregnancy, third trimester: Secondary | ICD-10-CM

## 2021-01-26 DIAGNOSIS — Z3480 Encounter for supervision of other normal pregnancy, unspecified trimester: Secondary | ICD-10-CM

## 2021-01-26 DIAGNOSIS — Z3A30 30 weeks gestation of pregnancy: Secondary | ICD-10-CM

## 2021-01-26 LAB — POCT URINALYSIS DIPSTICK
Bilirubin, UA: NEGATIVE
Blood, UA: NEGATIVE
Glucose, UA: NEGATIVE
Ketones, UA: NEGATIVE
Leukocytes, UA: NEGATIVE
Nitrite, UA: NEGATIVE
Odor: POSITIVE
Protein, UA: NEGATIVE
Spec Grav, UA: 1.01 (ref 1.010–1.025)
Urobilinogen, UA: 0.2 E.U./dL
pH, UA: 5 (ref 5.0–8.0)

## 2021-01-26 NOTE — Patient Instructions (Signed)
Return to office for any scheduled appointments. Call the office or go to the MAU at [facility] [place] if:  You begin to have strong, frequent contractions  Your water breaks.  Sometimes it is a big gush of fluid, sometimes it is just a trickle that keeps getting your panties wet or running down your legs  You have vaginal bleeding.  It is normal to have a small amount of spotting if your cervix was checked.   You do not feel your baby moving like normal.  If you do not, get something to eat and drink and lay down and focus on feeling your baby move.   If your baby is still not moving like normal, you should call the office or go to MAU.  Any other obstetric concerns.   

## 2021-01-26 NOTE — Progress Notes (Signed)
   PRENATAL VISIT NOTE  Subjective:  Donna Huang is a 20 y.o. G3P1011 at [redacted]w[redacted]d being seen today for ongoing prenatal care.  She is currently monitored for the following issues for this low-risk pregnancy and has Patellar dislocation; Attention deficit disorder (ADD) without hyperactivity; Dysmenorrhea; Supervision of other normal pregnancy, antepartum; Short interval between pregnancies affecting pregnancy, antepartum; Rh negative status during pregnancy; and Vitamin D deficiency on their problem list.  Patient reports vaginal odor when she urinates.   Contractions: Irritability. Vag. Bleeding: None.  Movement: Present. Denies leaking of fluid.   The following portions of the patient's history were reviewed and updated as appropriate: allergies, current medications, past family history, past medical history, past social history, past surgical history and problem list.   Objective:   Vitals:   01/26/21 0945  BP: (!) 97/58  Pulse: 89  Weight: 180 lb (81.6 kg)    Fetal Status: Fetal Heart Rate (bpm): 134 Fundal Height: 28 cm Movement: Present     General:  Alert, oriented and cooperative. Patient is in no acute distress.  Skin: Skin is warm and dry. No rash noted.   Cardiovascular: Normal heart rate noted  Respiratory: Normal respiratory effort, no problems with respiration noted  Abdomen: Soft, gravid, appropriate for gestational age.  Pain/Pressure: Absent     Pelvic: Cervical exam deferred       Pelvic culture swab obtained, chaperone present.  Extremities: Normal range of motion.  Edema: None  Mental Status: Normal mood and affect. Normal behavior. Normal judgment and thought content.   Results for orders placed or performed in visit on 01/26/21 (from the past 24 hour(s))  POCT Urinalysis Dipstick     Status: None   Collection Time: 01/26/21  9:54 AM  Result Value Ref Range   Color, UA     Clarity, UA     Glucose, UA Negative Negative   Bilirubin, UA neg    Ketones, UA  neg    Spec Grav, UA 1.010 1.010 - 1.025   Blood, UA neg    pH, UA 5.0 5.0 - 8.0   Protein, UA Negative Negative   Urobilinogen, UA 0.2 0.2 or 1.0 E.U./dL   Nitrite, UA neg    Leukocytes, UA Negative Negative   Appearance     Odor Positive     Assessment and Plan:  Pregnancy: G3P1011 at [redacted]w[redacted]d 1. Vaginal odor - POCT Urinalysis Dipstick negative - Cervicovaginal ancillary only done, will follow up results and manage accordingly.  2. [redacted] weeks gestation of pregnancy 3. Supervision of other normal pregnancy, antepartum Preterm labor symptoms and general obstetric precautions including but not limited to vaginal bleeding, contractions, leaking of fluid and fetal movement were reviewed in detail with the patient. Please refer to After Visit Summary for other counseling recommendations.   Return in about 2 weeks (around 02/09/2021) for OFFICE OB VISIT (MD or APP).  Future Appointments  Date Time Provider Department Center  03/03/2021  9:15 AM [facility] NURSE [facility]  03/03/2021  9:30 AM [facility] US3 [facility]    Jaynie Collins, MD

## 2021-01-27 LAB — CERVICOVAGINAL ANCILLARY ONLY
Bacterial Vaginitis (gardnerella): NEGATIVE
Candida Glabrata: NEGATIVE
Candida Vaginitis: NEGATIVE
Comment: NEGATIVE
Comment: NEGATIVE
Comment: NEGATIVE
Comment: NEGATIVE
Trichomonas: NEGATIVE

## 2021-01-30 ENCOUNTER — Other Ambulatory Visit: Payer: Self-pay | Admitting: Certified Nurse Midwife

## 2021-01-30 DIAGNOSIS — O210 Mild hyperemesis gravidarum: Secondary | ICD-10-CM

## 2021-02-03 ENCOUNTER — Other Ambulatory Visit: Payer: Self-pay

## 2021-02-03 DIAGNOSIS — O210 Mild hyperemesis gravidarum: Secondary | ICD-10-CM

## 2021-02-07 ENCOUNTER — Other Ambulatory Visit: Payer: Self-pay

## 2021-02-07 ENCOUNTER — Ambulatory Visit (INDEPENDENT_AMBULATORY_CARE_PROVIDER_SITE_OTHER): Payer: Managed Care, Other (non HMO) | Admitting: Obstetrics and Gynecology

## 2021-02-07 VITALS — BP 95/63 | HR 106 | Wt 180.0 lb

## 2021-02-07 DIAGNOSIS — Z3480 Encounter for supervision of other normal pregnancy, unspecified trimester: Secondary | ICD-10-CM

## 2021-02-07 NOTE — Progress Notes (Signed)
   PRENATAL VISIT NOTE  Subjective:  Donna Huang is a 20 y.o. G3P1011 at [redacted]w[redacted]d being seen today for ongoing prenatal care.  She is currently monitored for the following issues for this low-risk pregnancy and has Patellar dislocation; Attention deficit disorder (ADD) without hyperactivity; Dysmenorrhea; Supervision of other normal pregnancy, antepartum; Short interval between pregnancies affecting pregnancy, antepartum; Rh negative status during pregnancy; and Vitamin D deficiency on their problem list.  Patient reports no complaints.  Contractions: Not present. Vag. Bleeding: None.  Movement: Present. Denies leaking of fluid.   The following portions of the patient's history were reviewed and updated as appropriate: allergies, current medications, past family history, past medical history, past social history, past surgical history and problem list.   Objective:   Vitals:   02/07/21 1025  BP: 95/63  Pulse: (!) 106  Weight: 180 lb (81.6 kg)    Fetal Status: Fetal Heart Rate (bpm): 154   Movement: Present     General:  Alert, oriented and cooperative. Patient is in no acute distress.  Skin: Skin is warm and dry. No rash noted.   Cardiovascular: Normal heart rate noted  Respiratory: Normal respiratory effort, no problems with respiration noted  Abdomen: Soft, gravid, appropriate for gestational age.  Pain/Pressure: Present     Pelvic: Cervical exam deferred        Extremities: Normal range of motion.  Edema: None  Mental Status: Normal mood and affect. Normal behavior. Normal judgment and thought content.   Assessment and Plan:  Pregnancy: G3P1011 at [redacted]w[redacted]d  1. Supervision of other normal pregnancy, antepartum  Doing great No concerns  BP good. Some pelvic pressure, recommend pregnancy support belt.  [place] for growth, keep this appt on 12/2- questions answered regarding growth.    Preterm labor symptoms and general obstetric precautions including but not limited to vaginal  bleeding, contractions, leaking of fluid and fetal movement were reviewed in detail with the patient. Please refer to After Visit Summary for other counseling recommendations.   No follow-ups on file.  Future Appointments  Date Time Provider Department Center  02/20/2021  1:30 PM Lesly Dukes, MD [facility]  03/03/2021  9:15 AM [facility] NURSE [facility]  03/03/2021  9:30 AM [facility] US3 [facility]    Venia Carbon, NP

## 2021-02-16 ENCOUNTER — Telehealth: Payer: Self-pay | Admitting: *Deleted

## 2021-02-16 NOTE — Telephone Encounter (Signed)
Pt is [redacted] weeks pregnant and her 60 lb Lab fell up against her stomach and she was concerned.  She states she feels good fetal movement, she denies any bleeding and there was no scratches from toenails.  She just wanted to check and make sure she didn't need to come in.  She will watch for any changes in fetal movement or bleeding.  If she has any of the above she will call the office or go to [facility].

## 2021-02-20 ENCOUNTER — Encounter: Payer: Self-pay | Admitting: Obstetrics & Gynecology

## 2021-02-20 ENCOUNTER — Ambulatory Visit (INDEPENDENT_AMBULATORY_CARE_PROVIDER_SITE_OTHER): Payer: Managed Care, Other (non HMO) | Admitting: Obstetrics & Gynecology

## 2021-02-20 ENCOUNTER — Other Ambulatory Visit: Payer: Self-pay

## 2021-02-20 VITALS — BP 99/67 | HR 99 | Wt 184.0 lb

## 2021-02-20 DIAGNOSIS — Z3480 Encounter for supervision of other normal pregnancy, unspecified trimester: Secondary | ICD-10-CM

## 2021-02-20 DIAGNOSIS — I863 Vulval varices: Secondary | ICD-10-CM

## 2021-02-20 MED ORDER — SERTRALINE HCL 25 MG PO TABS: ORAL_TABLET | ORAL | 3 refills | Status: DC

## 2021-02-20 NOTE — Progress Notes (Signed)
   PRENATAL VISIT NOTE  Subjective:  Donna Huang is a 20 y.o. G3P1011 at [redacted]w[redacted]d being seen today for ongoing prenatal care.  She is currently monitored for the following issues for this low-risk pregnancy and has Patellar dislocation; Attention deficit disorder (ADD) without hyperactivity; Dysmenorrhea; Supervision of other normal pregnancy, antepartum; Short interval between pregnancies affecting pregnancy, antepartum; Rh negative status during pregnancy; and Vitamin D deficiency on their problem list.  Patient reports  feelings of tremendous pressure in her hip bones especially when she pulls her legs together.  She also feels pressure in her labia and feels they are engorged. .  Contractions: Not present. Vag. Bleeding: None.  Movement: Present. Denies leaking of fluid.   The following portions of the patient's history were reviewed and updated as appropriate: allergies, current medications, past family history, past medical history, past social history, past surgical history and problem list.   Objective:   Vitals:   02/20/21 1327  BP: 99/67  Pulse: 99  Weight: 184 lb (83.5 kg)    Fetal Status: Fetal Heart Rate (bpm): 138   Movement: Present     General:  Alert, oriented and cooperative. Patient is in no acute distress.  Skin: Skin is warm and dry. No rash noted.   Cardiovascular: Normal heart rate noted  Respiratory: Normal respiratory effort, no problems with respiration noted  Abdomen: Soft, gravid, appropriate for gestational age.  Pain/Pressure: Present     Pelvic: Patient has some labia majora varicosities which are attributing to her heaviness.  Patient also has pain over the pubic symphysis which is consistent with mild pubic symphysis separation.  Patient is able to ambulate well.         Extremities: Normal range of motion.  Edema: None  Mental Status: Normal mood and affect. Normal behavior. Normal judgment and thought content.   Assessment and Plan:  Pregnancy:  G3P1011 at [redacted]w[redacted]d 1. Supervision of other normal pregnancy, antepartum Mild pubic symphysis separation and vulvar varicosities. Mccayla considering post placental IUD GBS and cultures next visit.  Preterm labor symptoms and general obstetric precautions including but not limited to vaginal bleeding, contractions, leaking of fluid and fetal movement were reviewed in detail with the patient. Please refer to After Visit Summary for other counseling recommendations.   Return in about 2 weeks (around 03/06/2021).  Future Appointments  Date Time Provider Department Center  03/03/2021  9:15 AM [facility] NURSE [facility]  03/03/2021  9:30 AM [facility] US3 [facility]    Elsie Lincoln, MD

## 2021-02-24 ENCOUNTER — Other Ambulatory Visit: Payer: Self-pay

## 2021-02-24 ENCOUNTER — Inpatient Hospital Stay (HOSPITAL_COMMUNITY)
Admission: AD | Admit: 2021-02-24 | Discharge: 2021-02-24 | Disposition: A | Discharge status: Home / Self Care | Payer: Managed Care, Other (non HMO) | Attending: Obstetrics and Gynecology | Admitting: Obstetrics and Gynecology

## 2021-02-24 ENCOUNTER — Encounter (HOSPITAL_COMMUNITY): Payer: Self-pay | Admitting: Obstetrics and Gynecology

## 2021-02-24 DIAGNOSIS — M545 Low back pain, unspecified: Secondary | ICD-10-CM | POA: Insufficient documentation

## 2021-02-24 DIAGNOSIS — O26893 Other specified pregnancy related conditions, third trimester: Secondary | ICD-10-CM | POA: Insufficient documentation

## 2021-02-24 DIAGNOSIS — E559 Vitamin D deficiency, unspecified: Secondary | ICD-10-CM

## 2021-02-24 DIAGNOSIS — R000 Tachycardia, unspecified: Secondary | ICD-10-CM | POA: Diagnosis not present

## 2021-02-24 DIAGNOSIS — O368130 Decreased fetal movements, third trimester, not applicable or unspecified: Secondary | ICD-10-CM | POA: Diagnosis present

## 2021-02-24 DIAGNOSIS — N949 Unspecified condition associated with female genital organs and menstrual cycle: Secondary | ICD-10-CM

## 2021-02-24 DIAGNOSIS — R102 Pelvic and perineal pain: Secondary | ICD-10-CM | POA: Insufficient documentation

## 2021-02-24 DIAGNOSIS — O09899 Supervision of other high risk pregnancies, unspecified trimester: Secondary | ICD-10-CM

## 2021-02-24 DIAGNOSIS — Z3A34 34 weeks gestation of pregnancy: Secondary | ICD-10-CM

## 2021-02-24 LAB — URINALYSIS, ROUTINE W REFLEX MICROSCOPIC
Bilirubin Urine: NEGATIVE
Glucose, UA: NEGATIVE mg/dL
Hgb urine dipstick: NEGATIVE
Ketones, ur: NEGATIVE mg/dL
Leukocytes,Ua: NEGATIVE
Nitrite: NEGATIVE
Protein, ur: NEGATIVE mg/dL
Specific Gravity, Urine: 1.008 (ref 1.005–1.030)
pH: 7 (ref 5.0–8.0)

## 2021-02-24 MED ORDER — CYCLOBENZAPRINE HCL 5 MG PO TABS: 5.0000 mg | ORAL_TABLET | ORAL | 0 refills | Status: DC | PRN

## 2021-02-24 MED ORDER — CYCLOBENZAPRINE HCL 5 MG PO TABS
5.0000 mg | ORAL_TABLET | Freq: Once | ORAL | Status: AC
  Administered 2021-02-24: 5 mg via ORAL
  Filled 2021-02-24: qty 1

## 2021-02-24 NOTE — MAU Provider Note (Signed)
History     CSN: [id number]  Arrival date and time: 02/24/21 1948     Chief Complaint  Patient presents with   Decreased Fetal Movement   Back Pain   HPI 20 yo G3P1011 at [redacted]w[redacted]d who presents with worsening of her side and groin pain, pelvic pressure and also low back pain. Reports has not taken anything for pain today as she tries to avoid medications if possible.   She initially reported DFM but once in MAU rooms reports she is feeling baby move.   She denies bleeding, denies consistent and frequent abdominal cramping or contractions,and denies change in discharge. Reports feels sporadic contractions a couple of times a day and last one was around 5PM.     OB History     Gravida  3   Para  1   Term  1   Preterm      AB  1   Living  1      SAB  1   IAB      Ectopic      Multiple  0   Live Births  1           Past Medical History:  Diagnosis Date   ADD (attention deficit disorder)    ADHD (attention deficit hyperactivity disorder)     Past Surgical History:  Procedure Laterality Date   KNEE SURGERY Left    TONSILLECTOMY      History reviewed. No pertinent family history.  Social History   Tobacco Use   Smoking status: Never   Smokeless tobacco: Never  Vaping Use   Vaping Use: Never used  Substance Use Topics   Alcohol use: No   Drug use: No    Allergies: No Known Allergies  No medications prior to admission.    Review of Systems  Constitutional:  Negative for chills and fever.  Respiratory:  Negative for chest tightness.   Cardiovascular:  Negative for chest pain.  Gastrointestinal:  Negative for abdominal distention, abdominal pain, blood in stool, constipation, diarrhea, nausea and vomiting.  Endocrine: Negative for polydipsia and polyuria.  Genitourinary:  Negative for dysuria, flank pain and frequency.       Pelvic pressure  Musculoskeletal:  Positive for back pain.  Skin:  Negative for rash.  Neurological:  Negative  for headaches.  All other systems reviewed and are negative. Physical Exam   Blood pressure 113/63, pulse (!) 101, temperature 98.9 F (37.2 C), temperature source Oral, resp. rate 15, last menstrual period 06/17/2020, SpO2 98 %, unknown if currently breastfeeding.  Physical Exam Vitals reviewed.  Constitutional:      Appearance: Normal appearance.  HENT:     Head: Normocephalic.  Cardiovascular:     Rate and Rhythm: Regular rhythm.     Pulses: Normal pulses.     Comments: Tachycardic initially, then improved Pulmonary:     Effort: Pulmonary effort is normal.  Abdominal:     Palpations: There is no mass.     Tenderness: There is no right CVA tenderness, left CVA tenderness, guarding or rebound.     Comments: Gravid, no TTP in suprapubic area. Had TTP along groin crease bilaterally. No other areas of TTP  Genitourinary:    Comments: Cervix 0.5cm external, closed internal os/thick/ballotable Musculoskeletal:     Comments: Tightness of lower back on palpation  Skin:    General: Skin is warm.     Capillary Refill: Capillary refill takes less than 2 seconds.  Neurological:  General: No focal deficit present.     Mental Status: She is alert and oriented to person, place, and time.  Psychiatric:        Mood and Affect: Mood normal.    MAU Course  Procedures NST reactive Baseline: 150s, accels: 4 accels seen (15x15), no decels Contractions: none while on monitor  MDM Moderate  Assessment and Plan  20 yo G3P1011 at [redacted]w[redacted]d who presents with worsening of her side and groin pain, pelvic pressure and also low back pain. Also initially reported DFM  #DFM At [redacted]w[redacted]d reported DFM when initially presented, by the time seen by provider reports feeling baby move. Reactive NST with 15x15 accels (x4). - return precautions and kick count information given to patient  #Pelvic pressure #Groin discomfort Has history of groin discomfort likely secondary to round ligament etiology  throughout third trimester. Patient's pelvic pressure likely worsening in setting of increasing size of pregnancy. On monitor no contractions and also subjectively declines contractions. On exam cervix unchanged from last exam in 12/2020 (which was done for a similar presentation). Given no contractions, unchanged cervix, no bleeding, and pain that is in groin and in typical round ligament distribution low suspicion for preterm labor.  - Ample reassurance provided - discussed using tylenol as needed for pain - as well as discussed trigger spots where massage can be helpful for atleast temporary relief of pain in some people - discussed return precautions in detail including consistent frequent contractions, bleeding, and signs of rupture of membranes   #Low back pain Has low back pain and some muscle tightness. No urinary symptoms. Hemodynamically stable.  - Flexeril provided while in MAU and 10 pills of PRN prescription sent to patient's pharmacy  #Tachycardia Patient initially tachycardic to 110s. SpO2 normal. On my bedside exam in high 90s, low 100s. Given asymptomatic, normal O2 sats, reassuring.  - continue to monitor during visits - can consider EKG at next visit if persistent or becomes symptomatic   Warner Mccreedy, MD, MPH OB Fellow, Faculty Practice

## 2021-02-24 NOTE — Discharge Instructions (Signed)
You came to the MAU because you had pain of your lower back and your groin and also pressure in your pelvis. We examined you and your cervix was unchanged from when we examined you back in September. We also watched you on the monitor and your baby's heart rate looked great and you did not have any contractions on the monitor.  We think your back pain is likely expected back pain that comes with baby growing in third trimester. We gave you a muscle relaxant and felt you were safe to go home.

## 2021-02-24 NOTE — MAU Note (Signed)
Pt reports bilateral pressure in hip area, was seen in office on Monday but the pain has worsened since then. Today began having lower back pain. Decreased fetal movement since 4 pm today. Denies bleeding.

## 2021-02-27 ENCOUNTER — Emergency Department (INDEPENDENT_AMBULATORY_CARE_PROVIDER_SITE_OTHER)
Admission: EM | Admit: 2021-02-27 | Discharge: 2021-02-27 | Disposition: A | Discharge status: Home / Self Care | Payer: Managed Care, Other (non HMO) | Source: Home / Self Care

## 2021-02-27 ENCOUNTER — Other Ambulatory Visit: Payer: Self-pay

## 2021-02-27 DIAGNOSIS — J101 Influenza due to other identified influenza virus with other respiratory manifestations: Secondary | ICD-10-CM

## 2021-02-27 DIAGNOSIS — R52 Pain, unspecified: Secondary | ICD-10-CM | POA: Diagnosis not present

## 2021-02-27 DIAGNOSIS — J0100 Acute maxillary sinusitis, unspecified: Secondary | ICD-10-CM

## 2021-02-27 LAB — POC INFLUENZA A AND B ANTIGEN (URGENT CARE ONLY)
Influenza A Ag: POSITIVE — AB
Influenza B Ag: NEGATIVE

## 2021-02-27 MED ORDER — CEPHALEXIN 500 MG PO CAPS: 1000.0000 mg | ORAL_CAPSULE | Freq: Two times a day (BID) | ORAL | 0 refills | Status: AC

## 2021-02-27 MED ORDER — OSELTAMIVIR PHOSPHATE 75 MG PO CAPS
75.0000 mg | ORAL_CAPSULE | Freq: Two times a day (BID) | ORAL | 0 refills | Status: DC
Start: 2021-02-27 — End: 2021-03-11

## 2021-02-27 NOTE — ED Provider Notes (Signed)
[facility]    CSN: [id number] Arrival date & time: 02/27/21  0831      History   Chief Complaint Chief Complaint  Patient presents with   Cough   Fever   Nasal Congestion   Generalized Body Aches    HPI Donna Huang is a 20 y.o. female.   HPI 20 year old female presents with cough, sinus nasal congestion, runny nose,and body aches since Friday.  Reports temperature max 101 on Sunday.  Patient reports dad currently has flu a and was diagnosed yesterday.  Patient reports is pregnant with estimated due date of 04/02/2021.  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 Authorizing Provider  cephALEXin (KEFLEX) 500 MG capsule Take 2 capsules (1,000 mg total) by mouth 2 (two) times daily for 7 days. 02/27/21 03/06/21 Yes Trevor Iha, FNP  oseltamivir (TAMIFLU) 75 MG capsule Take 1 capsule (75 mg total) by mouth every 12 (twelve) hours. 02/27/21  Yes Trevor Iha, FNP  cyclobenzaprine (FLEXERIL) 5 MG tablet Take 1 tablet (5 mg total) by mouth as needed for muscle spasms (low back pain). 02/24/21   Warner Mccreedy, MD  Doxylamine-Pyridoxine 10-10 MG TBEC Take 2 tablets by mouth at bedtime. May also take 1 tab in am and 1 tab in afternoon Patient not taking: Reported on 02/07/2021 08/26/20   Donette Larry, CNM  famotidine (PEPCID) 20 MG tablet TAKE 1 TABLET BY MOUTH AT BEDTIME 02/17/21   Donette Larry, CNM  Prenatal Vit-Fe Fumarate-FA (PRENATAL VITAMINS PO) Take 1 capsule by mouth daily.    [provider]  promethazine (PHENERGAN) 25 MG tablet Take 1 tablet (25 mg total) by mouth every 6 (six) hours as needed for nausea or vomiting. Patient not taking: Reported on 01/13/2021 08/18/20   Lesly Dukes, MD  sertraline (ZOLOFT) 25 MG tablet Take one tablet in the morning for two weeks; increase to two tablets in the morning if needed and not having side effects. 02/20/21   Lesly Dukes, MD    Family History History reviewed. No pertinent family history.  Social History Social History   Tobacco Use   Smoking status: Never   Smokeless tobacco: Never  Vaping Use   Vaping Use: Never used  Substance Use Topics   Alcohol use: No   Drug use: No     Allergies   Patient has  no known allergies.   Review of Systems Review of Systems  Constitutional:  Positive for activity change and fever.  HENT:  Positive for congestion.   Respiratory:  Positive for cough.   Musculoskeletal:  Positive for myalgias.  All other systems reviewed and are negative.   Physical Exam Triage Vital Signs ED Triage Vitals  Enc Vitals Group     BP 02/27/21 0845 104/70     Pulse Rate 02/27/21 0845 (!) 116     Resp 02/27/21 0845 17     Temp 02/27/21 0845 97.8 F (36.6 C)     Temp Source 02/27/21 0845 Oral     SpO2 02/27/21 0845 97 %     Weight --      Height --      Head Circumference --      Peak Flow --      Pain Score 02/27/21 0846 0     Pain Loc --      Pain Edu? --      Excl. in GC? --    No data found.  Updated Vital Signs BP 104/70 (BP Location: Right Arm)   Pulse (!) 116    Temp 97.8 F (36.6 C) (Oral)   Resp 17   LMP 06/17/2020   SpO2 97%      Physical Exam Vitals and nursing note reviewed.  Constitutional:      General: She is not in acute distress.    Appearance: Normal appearance. She is normal weight.  HENT:     Head: Normocephalic and atraumatic.     Right Ear: Tympanic membrane and external ear normal.     Left Ear: Tympanic membrane and external ear normal.     Ears:     Comments: Moderate eustachian tube noticed bilaterally    Mouth/Throat:     Mouth: Mucous membranes are moist.     Pharynx: Oropharynx is clear.     Comments: Moderate amount of clear drainage of posterior oropharynx noted Eyes:     Extraocular Movements: Extraocular movements intact.     Conjunctiva/sclera: Conjunctivae normal.     Pupils: Pupils are equal, round, and reactive to light.  Cardiovascular:     Rate and Rhythm: Normal rate and regular rhythm.     Pulses: Normal pulses.     Heart sounds: Normal heart sounds.  Pulmonary:     Effort: Pulmonary effort is normal.     Breath sounds: Normal breath sounds. No wheezing, rhonchi or rales.  Musculoskeletal:        General: Normal range of motion.     Cervical back: Normal range of motion and neck supple. No tenderness.  Lymphadenopathy:     Cervical: Cervical adenopathy present.  Skin:    General: Skin is warm and dry.  Neurological:     General: No focal deficit present.     Mental Status: She is alert and oriented to person, place, and time.     UC Treatments / Results  Labs (all labs ordered are listed, but only abnormal results are displayed) Labs Reviewed  POC INFLUENZA A AND B ANTIGEN (URGENT CARE ONLY) - Abnormal; Notable for the following components:      Result Value   Influenza A Ag Positive (*)    All other components within normal limits    EKG   Radiology No results found.  Procedures Procedures (including critical care time)  Medications Ordered in UC Medications - No data to  display  Initial Impression /  Assessment and Plan / UC Course  I have reviewed the triage vital signs and the nursing notes.  Pertinent labs & imaging results that were available during my care of the patient were reviewed by me and considered in my medical decision making (see chart for details).     MDM: 1.  Generalized body aches-advised OTC Tylenol 1000 mg 1-2 times daily, as needed; 2.  Influenza A-Rx'd Tamiflu; 3.  Subacute maxillary sinusitis-Rx'd Keflex. Advised patient to take Tamiflu as directed with food to completion.  Advised patient if sinus nasal congestion/cough has worsened on Saturday, 03/04/2021 may start Keflex.  Encouraged patient to increase daily water intake while taking these medications.  Advised patient may take Tylenol for fever 1000 mg 1-2 times daily, as needed.  Discharged home, hemodynamically stable. Final Clinical Impressions(s) / UC Diagnoses   Final diagnoses:  Generalized body aches  Influenza A  Subacute maxillary sinusitis     Discharge Instructions      Advised patient to take Tamiflu as directed with food to completion.  Advised patient if sinus nasal congestion/cough has worsened on Saturday, 03/04/2021 may start Keflex.  Encouraged patient to increase daily water intake while taking these medications.  Advised patient may take Tylenol for fever 1000 mg 1-2 times daily, as needed.     ED Prescriptions     Medication Sig Dispense Auth. Provider   oseltamivir (TAMIFLU) 75 MG capsule Take 1 capsule (75 mg total) by mouth every 12 (twelve) hours. 10 capsule Trevor Iha, FNP   cephALEXin (KEFLEX) 500 MG capsule Take 2 capsules (1,000 mg total) by mouth 2 (two) times daily for 7 days. 28 capsule Trevor Iha, FNP      PDMP not reviewed this encounter.   Trevor Iha, FNP 02/27/21 1004

## 2021-02-27 NOTE — ED Triage Notes (Signed)
Pt c/o cough, runny nose and bodyaches since Friday. Tmax 101 fever on Sunday. Dad is currently pos for Flu A, was dx yesterday. Taking tylenol and robitussin prn. Pt is currently pregnant. EDD 04/02/21

## 2021-02-27 NOTE — Discharge Instructions (Addendum)
Advised patient to take Tamiflu as directed with food to completion.  Advised patient if sinus nasal congestion/cough has worsened on Saturday, 03/04/2021 may start Keflex.  Encouraged patient to increase daily water intake while taking these medications.  Advised patient may take Tylenol for fever 1000 mg 1-2 times daily, as needed.

## 2021-03-03 ENCOUNTER — Ambulatory Visit: Payer: Managed Care, Other (non HMO) | Admitting: *Deleted

## 2021-03-03 ENCOUNTER — Ambulatory Visit: Payer: Managed Care, Other (non HMO) | Attending: Obstetrics and Gynecology

## 2021-03-03 ENCOUNTER — Encounter: Payer: Self-pay | Admitting: *Deleted

## 2021-03-03 ENCOUNTER — Other Ambulatory Visit: Payer: Self-pay

## 2021-03-03 VITALS — BP 111/62 | HR 103

## 2021-03-03 DIAGNOSIS — Z6791 Unspecified blood type, Rh negative: Secondary | ICD-10-CM

## 2021-03-03 DIAGNOSIS — O26893 Other specified pregnancy related conditions, third trimester: Secondary | ICD-10-CM | POA: Insufficient documentation

## 2021-03-03 DIAGNOSIS — O09899 Supervision of other high risk pregnancies, unspecified trimester: Secondary | ICD-10-CM | POA: Diagnosis present

## 2021-03-03 DIAGNOSIS — O26849 Uterine size-date discrepancy, unspecified trimester: Secondary | ICD-10-CM | POA: Diagnosis not present

## 2021-03-03 DIAGNOSIS — E559 Vitamin D deficiency, unspecified: Secondary | ICD-10-CM

## 2021-03-03 DIAGNOSIS — O26843 Uterine size-date discrepancy, third trimester: Secondary | ICD-10-CM | POA: Diagnosis not present

## 2021-03-03 DIAGNOSIS — Z3A35 35 weeks gestation of pregnancy: Secondary | ICD-10-CM

## 2021-03-10 ENCOUNTER — Other Ambulatory Visit (HOSPITAL_COMMUNITY)
Admission: RE | Admit: 2021-03-10 | Discharge: 2021-03-10 | Disposition: A | Discharge status: Home / Self Care | Payer: Managed Care, Other (non HMO) | Source: Ambulatory Visit | Attending: Obstetrics and Gynecology | Admitting: Obstetrics and Gynecology

## 2021-03-10 ENCOUNTER — Ambulatory Visit (INDEPENDENT_AMBULATORY_CARE_PROVIDER_SITE_OTHER): Payer: Managed Care, Other (non HMO) | Admitting: Obstetrics and Gynecology

## 2021-03-10 ENCOUNTER — Other Ambulatory Visit: Payer: Self-pay

## 2021-03-10 VITALS — BP 97/65 | HR 87 | Wt 184.0 lb

## 2021-03-10 DIAGNOSIS — Z3480 Encounter for supervision of other normal pregnancy, unspecified trimester: Secondary | ICD-10-CM | POA: Diagnosis present

## 2021-03-10 NOTE — Progress Notes (Signed)
   PRENATAL VISIT NOTE  Subjective:  Donna Huang is a 20 y.o. G3P1011 at [redacted]w[redacted]d being seen today for ongoing prenatal care.  She is currently monitored for the following issues for this low-risk pregnancy and has Patellar dislocation; Attention deficit disorder (ADD) without hyperactivity; Dysmenorrhea; Supervision of other normal pregnancy, antepartum; Short interval between pregnancies affecting pregnancy, antepartum; Rh negative status during pregnancy; and Vitamin D deficiency on their problem list.  Patient reports no complaints.  Contractions: Irritability. Vag. Bleeding: None.  Movement: Present. Denies leaking of fluid.   The following portions of the patient's history were reviewed and updated as appropriate: allergies, current medications, past family history, past medical history, past social history, past surgical history and problem list.   Objective:   Vitals:   03/10/21 1006  BP: 97/65  Pulse: 87  Weight: 184 lb (83.5 kg)    Fetal Status: Fetal Heart Rate (bpm): 150 Fundal Height: 36 cm Movement: Present  Presentation: Vertex  General:  Alert, oriented and cooperative. Patient is in no acute distress.  Skin: Skin is warm and dry. No rash noted.   Cardiovascular: Normal heart rate noted  Respiratory: Normal respiratory effort, no problems with respiration noted  Abdomen: Soft, gravid, appropriate for gestational age.  Pain/Pressure: Present     Pelvic: Cervical exam performed in the presence of a chaperone Dilation: 1.5 Effacement (%): 40 Station: -3  Extremities: Normal range of motion.  Edema: None  Mental Status: Normal mood and affect. Normal behavior. Normal judgment and thought content.   Assessment and Plan:  Pregnancy: G3P1011 at [redacted]w[redacted]d  1. Supervision of other normal pregnancy, antepartum  - Culture, beta strep (group b only) - Cervicovaginal ancillary only( [place])  - She continues to have pulling pain in lower pelvis, difficult to get out bed in  the AM d/t pain. Rates pain in the AM 10/10 at times. She is wearing a support belt during the day. Discuss symphysis pubis separation and possible induction at 39 weeks if patient persists. She is a multip with a favorable cervix. Try magnesium at bedtime.  Some mild constipation- recommend Miralx and colace.    Preterm labor symptoms and general obstetric precautions including but not limited to vaginal bleeding, contractions, leaking of fluid and fetal movement were reviewed in detail with the patient. Please refer to After Visit Summary for other counseling recommendations.   Return in about 1 week (around 03/17/2021).  Future Appointments  Date Time Provider Department Center  03/17/2021 10:50 AM Donette Larry, CNM [facility]    Venia Carbon, NP

## 2021-03-11 ENCOUNTER — Inpatient Hospital Stay (HOSPITAL_COMMUNITY)
Admission: AD | Admit: 2021-03-11 | Discharge: 2021-03-11 | Disposition: A | Discharge status: Home / Self Care | Payer: Managed Care, Other (non HMO) | Attending: Obstetrics & Gynecology | Admitting: Obstetrics & Gynecology

## 2021-03-11 ENCOUNTER — Encounter (HOSPITAL_COMMUNITY): Payer: Self-pay | Admitting: Obstetrics & Gynecology

## 2021-03-11 ENCOUNTER — Inpatient Hospital Stay (HOSPITAL_BASED_OUTPATIENT_CLINIC_OR_DEPARTMENT_OTHER): Payer: Managed Care, Other (non HMO)

## 2021-03-11 ENCOUNTER — Other Ambulatory Visit: Payer: Self-pay

## 2021-03-11 DIAGNOSIS — Z3A36 36 weeks gestation of pregnancy: Secondary | ICD-10-CM | POA: Insufficient documentation

## 2021-03-11 DIAGNOSIS — O368190 Decreased fetal movements, unspecified trimester, not applicable or unspecified: Secondary | ICD-10-CM

## 2021-03-11 DIAGNOSIS — Z3689 Encounter for other specified antenatal screening: Secondary | ICD-10-CM | POA: Insufficient documentation

## 2021-03-11 DIAGNOSIS — O368130 Decreased fetal movements, third trimester, not applicable or unspecified: Secondary | ICD-10-CM | POA: Insufficient documentation

## 2021-03-11 DIAGNOSIS — O26893 Other specified pregnancy related conditions, third trimester: Secondary | ICD-10-CM

## 2021-03-11 DIAGNOSIS — E559 Vitamin D deficiency, unspecified: Secondary | ICD-10-CM

## 2021-03-11 DIAGNOSIS — O09899 Supervision of other high risk pregnancies, unspecified trimester: Secondary | ICD-10-CM

## 2021-03-11 NOTE — MAU Provider Note (Signed)
History     CSN: [id number]  Arrival date and time: 03/11/21 6301   Event Date/Time   First Provider Initiated Contact with Patient 03/11/21 2026      Chief Complaint  Patient presents with   Decreased Fetal Movement   Donna Huang is a 20 y.o. G3P1011 at [redacted]w[redacted]d who receives care at [facility].  She presents today for Decreased Fetal Movement. She states she realized around lunch time that she had not felt fetal movement.  She states after noticing that it was "only a handful of times."  She states when it does occur it is "not as strong."  Patient endorses movement since arrival that she describes as "little waves."  She denies abdominal cramping or contractions, but reports "my stomach tightens up."  She denies vaginal bleeding, discharge, or leaking of fluid.  Patient reports she last ate at 1930 and had "2-4 bottles" of water today.    OB History     Gravida  3   Para  1   Term  1   Preterm      AB  1   Living  1      SAB  1   IAB      Ectopic      Multiple  0   Live Births  1           Past Medical History:  Diagnosis Date   ADD (attention deficit disorder)    ADHD (attention deficit hyperactivity disorder)     Past Surgical History:  Procedure Laterality Date   KNEE SURGERY Left    TONSILLECTOMY      No family history on file.  Social History   Tobacco Use   Smoking status: Never   Smokeless tobacco: Never  Vaping Use   Vaping Use: Never used  Substance Use Topics   Alcohol use: No   Drug use: No    Allergies: No Known Allergies  Medications Prior to Admission  Medication Sig Dispense Refill Last Dose   Doxylamine-Pyridoxine (DICLEGIS PO) Take by mouth at bedtime as needed.   03/10/2021   famotidine (PEPCID) 20 MG tablet TAKE 1 TABLET BY MOUTH AT BEDTIME 30 tablet 0 03/10/2021   Prenatal Vit-Fe Fumarate-FA (PRENATAL VITAMINS PO) Take 1 capsule by mouth daily.   03/11/2021 at 1800   cyclobenzaprine (FLEXERIL) 5 MG tablet Take 1  tablet (5 mg total) by mouth as needed for muscle spasms (low back pain). 10 tablet 0    oseltamivir (TAMIFLU) 75 MG capsule Take 1 capsule (75 mg total) by mouth every 12 (twelve) hours. 10 capsule 0     Review of Systems  Gastrointestinal:  Negative for abdominal pain, constipation, diarrhea, nausea and vomiting.  Genitourinary:  Negative for difficulty urinating, dysuria, vaginal bleeding and vaginal discharge.  Neurological:  Negative for dizziness, light-headedness and headaches.  Physical Exam   Blood pressure 106/68, pulse (!) 104, temperature 98.6 F (37 C), temperature source Oral, resp. rate 19, height 5\' 3"  (1.6 m), weight 86.5 kg, last menstrual period 06/17/2020, SpO2 100 %, unknown if currently breastfeeding.  Physical Exam Vitals reviewed.  Constitutional:      Appearance: Normal appearance.  HENT:     Head: Normocephalic and atraumatic.  Eyes:     Conjunctiva/sclera: Conjunctivae normal.  Cardiovascular:     Rate and Rhythm: Normal rate.  Pulmonary:     Effort: Pulmonary effort is normal. No respiratory distress.  Abdominal:     Comments: Gravid, Soft, Appears AGA.  No ctx palpated  Musculoskeletal:        General: Normal range of motion.     Cervical back: Normal range of motion.  Skin:    General: Skin is dry.  Neurological:     Mental Status: She is alert and oriented to person, place, and time.  Psychiatric:        Mood and Affect: Mood normal.        Behavior: Behavior normal.        Thought Content: Thought content normal.    Fetal Assessment 145 bpm, Mod Var, -Decels, +15x15 Accels Toco: No ctx graphed  MAU Course  No results found for this or any previous visit (from the past 24 hour(s)).    MDM PE Labs: None EFM BPP Assessment and Plan  20 year old G3P1011  SIUP at 36.6 weeks Cat I FT DFM  -Exam performed -Discussed continued monitoring -NST reactive. -Instructed to monitor movement with clicker. -Discussed possible BPP if m/m  continues to be limited.    Cherre Robins MSN, CNM 03/11/2021, 8:26 PM   Reassessment (9:14 PM) -Continued limited movement. -NST remains reactive.  -Will send for BPP for further reassurance.  Reassessment (10:39 PM)  -Preliminary BPP returns 8/8 -Precautions given -Encouraged to call primary office or return to MAU if symptoms worsen or with the onset of new symptoms. -Discharged to home in stable condition.  Cherre Robins MSN, CNM Advanced Practice Provider, [facility] [employer]

## 2021-03-11 NOTE — MAU Note (Signed)
Pt reports she has felt movements but that they remain decreased in intensity.

## 2021-03-11 NOTE — MAU Note (Signed)
...  Donna Huang is a 21 y.o. at [redacted]w[redacted]d here in MAU reporting: DFM since noon today as well as intermittent abdominal tightening since 1400 today. She states she has felt the baby move "a handful of times" since noon. No VB or LOF.   BP: 106/68 P: 104 T: 98.6 F oral R: 19 O2: 100  Pain score:  6/10 entire abdomen  FHT: 153 initial external Lab orders placed from triage: UA

## 2021-03-13 LAB — CERVICOVAGINAL ANCILLARY ONLY
Chlamydia: NEGATIVE
Comment: NEGATIVE
Comment: NORMAL
Neisseria Gonorrhea: NEGATIVE

## 2021-03-13 LAB — CULTURE, BETA STREP (GROUP B ONLY)
MICRO NUMBER:: 12737656
SPECIMEN QUALITY:: ADEQUATE

## 2021-03-17 ENCOUNTER — Other Ambulatory Visit: Payer: Self-pay

## 2021-03-17 ENCOUNTER — Ambulatory Visit (INDEPENDENT_AMBULATORY_CARE_PROVIDER_SITE_OTHER): Payer: Managed Care, Other (non HMO) | Admitting: Certified Nurse Midwife

## 2021-03-17 VITALS — BP 107/73 | HR 121 | Wt 186.0 lb

## 2021-03-17 DIAGNOSIS — Z3480 Encounter for supervision of other normal pregnancy, unspecified trimester: Secondary | ICD-10-CM

## 2021-03-17 DIAGNOSIS — Z3A37 37 weeks gestation of pregnancy: Secondary | ICD-10-CM

## 2021-03-17 NOTE — Progress Notes (Signed)
Subjective:  Donna Huang is a 20 y.o. G3P1011 at [redacted]w[redacted]d being seen today for ongoing prenatal care.  She is currently monitored for the following issues for this low-risk pregnancy and has Patellar dislocation; Attention deficit disorder (ADD) without hyperactivity; Dysmenorrhea; Supervision of other normal pregnancy, antepartum; Short interval between pregnancies affecting pregnancy, antepartum; Rh negative status during pregnancy; and Vitamin D deficiency on their problem list.  Patient reports  severe pelvic pain, pain with turning over in bed, rates 10/10 .Tried Tylenol, maternity belt, etc, nothing helping.  Contractions: Irritability. Vag. Bleeding: None.  Movement: Present. Denies leaking of fluid.   The following portions of the patient's history were reviewed and updated as appropriate: allergies, current medications, past family history, past medical history, past social history, past surgical history and problem list. Problem list updated.  Objective:   Vitals:   03/17/21 1045  BP: 107/73  Pulse: (!) 121  Weight: 186 lb (84.4 kg)    Fetal Status: Fetal Heart Rate (bpm): 145   Movement: Present     General:  Alert, oriented and cooperative. Patient is in no acute distress.  Skin: Skin is warm and dry. No rash noted.   Cardiovascular: Normal heart rate noted  Respiratory: Normal respiratory effort, no problems with respiration noted  Abdomen: Soft, gravid, appropriate for gestational age. Pain/Pressure: Present     Pelvic: Vag. Bleeding: None Vag D/C Character: Yellow   Cervical exam deferred        Extremities: Normal range of motion.  Edema: None  Mental Status: Normal mood and affect. Normal behavior. Normal judgment and thought content.   Urinalysis:      Assessment and Plan:  Pregnancy: G3P1011 at [redacted]w[redacted]d  1. Supervision of other normal pregnancy, antepartum  2. [redacted] weeks gestation of pregnancy  3. Pelvic pain - feels debilitating to pt, has tried multiple  remedies - would like eIOL at 39 wks - check cervix next visit and discuss r/b to eIOL   Term labor symptoms and general obstetric precautions including but not limited to vaginal bleeding, contractions, leaking of fluid and fetal movement were reviewed in detail with the patient. Please refer to After Visit Summary for other counseling recommendations.  Return in about 1 week (around 03/24/2021).   Donette Larry, CNM

## 2021-03-24 ENCOUNTER — Other Ambulatory Visit: Payer: Self-pay

## 2021-03-24 ENCOUNTER — Ambulatory Visit (INDEPENDENT_AMBULATORY_CARE_PROVIDER_SITE_OTHER): Payer: Managed Care, Other (non HMO) | Admitting: Advanced Practice Midwife

## 2021-03-24 VITALS — BP 136/81 | HR 116 | Wt 189.0 lb

## 2021-03-24 DIAGNOSIS — O26893 Other specified pregnancy related conditions, third trimester: Secondary | ICD-10-CM

## 2021-03-24 DIAGNOSIS — Z3480 Encounter for supervision of other normal pregnancy, unspecified trimester: Secondary | ICD-10-CM

## 2021-03-24 DIAGNOSIS — Z3A38 38 weeks gestation of pregnancy: Secondary | ICD-10-CM

## 2021-03-24 DIAGNOSIS — R102 Pelvic and perineal pain: Secondary | ICD-10-CM

## 2021-03-24 NOTE — Progress Notes (Signed)
° °  PRENATAL VISIT NOTE  Subjective:  Donna Huang is a 20 y.o. G3P1011 at [redacted]w[redacted]d being seen today for ongoing prenatal care.  She is currently monitored for the following issues for this low-risk pregnancy and has Patellar dislocation; Attention deficit disorder (ADD) without hyperactivity; Dysmenorrhea; Supervision of other normal pregnancy, antepartum; Short interval between pregnancies affecting pregnancy, antepartum; Rh negative status during pregnancy; and Vitamin D deficiency on their problem list.  Patient reports  pelvic pressure and irregular cramping .  Contractions: Irritability. Vag. Bleeding: None.  Movement: Present. Denies leaking of fluid.   The following portions of the patient's history were reviewed and updated as appropriate: allergies, current medications, past family history, past medical history, past social history, past surgical history and problem list.   Objective:   Vitals:   03/24/21 1056  BP: 136/81  Pulse: (!) 116  Weight: 189 lb (85.7 kg)    Fetal Status: Fetal Heart Rate (bpm): 162 Fundal Height: 37 cm Movement: Present  Presentation: Vertex  General:  Alert, oriented and cooperative. Patient is in no acute distress.  Skin: Skin is warm and dry. No rash noted.   Cardiovascular: Normal heart rate noted  Respiratory: Normal respiratory effort, no problems with respiration noted  Abdomen: Soft, gravid, appropriate for gestational age.  Pain/Pressure: Present     Pelvic: Cervical exam performed in the presence of a chaperone Dilation: 2.5 Effacement (%): 80 Station: -2  Extremities: Normal range of motion.  Edema: None  Mental Status: Normal mood and affect. Normal behavior. Normal judgment and thought content.   Assessment and Plan:  Pregnancy: G3P1011 at [redacted]w[redacted]d 1. Supervision of other normal pregnancy, antepartum --Anticipatory guidance about next visits/weeks of pregnancy given. --Pt desires elective IOL, discussed at previous visits --Favorable  cervix today at 2.5/80/-2, pt had successful IOL with previous pregnancy --Aware that IOL may lengthen labor, increase risk of cesarean section --Pt scheduled 03/29/21 --Reviewed labor readiness with patient including the [employer], evening primrose oil, and raspberry leaf tea.    2. [redacted] weeks gestation of pregnancy  3. Pelvic pressure in pregnancy, antepartum, third trimester --Rest/ice/heat/warm bath/increase PO fluids/Tylenol/pregnancy support belt   Term labor symptoms and general obstetric precautions including but not limited to vaginal bleeding, contractions, leaking of fluid and fetal movement were reviewed in detail with the patient. Please refer to After Visit Summary for other counseling recommendations.   Return in about 1 week (around 03/31/2021).  Future Appointments  Date Time Provider Department Center  03/29/2021  6:45 AM [facility] SCHED ROOM [facility] None  04/28/2021  8:30 AM Donna Huang, CNM [facility]    Donna Huang, CNM

## 2021-03-24 NOTE — Patient Instructions (Signed)
Things to Try After 37 weeks to Encourage Labor/Get Ready for Labor:  ° ° Try the Miles Circuit at www.milescircuit.com daily to improve baby's position and encourage the onset of labor. ° °Walk a little and rest a little every day.  Change positions often. ° °Cervical Ripening: May try one or both °Red Raspberry Leaf capsules or tea:  two 300mg or 400mg tablets with each meal, 2-3 times a day, or 1-3 cups of tea daily  °Potential Side Effects Of Raspberry Leaf:  °Most women do not experience any side effects from drinking raspberry leaf tea. However, nausea and loose stools are possible  ° °Evening Primrose Oil capsules: take 1 capsule by mouth and place one capsule in the vagina every night.    °Some of the potential side effects:  °Upset stomach  °Loose stools or diarrhea  °Headaches  °Nausea ° °Sex can also help the cervix ripen and encourage labor onset. °  °Reasons to return to MAU at [facility]: ° °1.  Contractions are  5 minutes apart or less, each last 1 minute, these have been going on for 1-2 hours, and you cannot walk or talk during them °2.  You have a large gush of fluid, or a trickle of fluid that will not stop and you have to wear a pad °3.  You have bleeding that is bright red, heavier than spotting--like menstrual bleeding (spotting can be normal in early labor or after a check of your cervix) °4.  You do not feel the baby moving like he/she normally does  °

## 2021-03-27 ENCOUNTER — Other Ambulatory Visit: Payer: Self-pay | Admitting: Advanced Practice Midwife

## 2021-03-29 ENCOUNTER — Inpatient Hospital Stay (HOSPITAL_COMMUNITY): Payer: Managed Care, Other (non HMO) | Admitting: Anesthesiology

## 2021-03-29 ENCOUNTER — Inpatient Hospital Stay (HOSPITAL_COMMUNITY): Payer: Managed Care, Other (non HMO)

## 2021-03-29 ENCOUNTER — Other Ambulatory Visit: Payer: Self-pay

## 2021-03-29 ENCOUNTER — Encounter (HOSPITAL_COMMUNITY): Payer: Self-pay | Admitting: Family Medicine

## 2021-03-29 ENCOUNTER — Inpatient Hospital Stay (HOSPITAL_COMMUNITY)
Admission: AD | Admit: 2021-03-29 | Discharge: 2021-03-31 | DRG: 807 | Disposition: A | Discharge status: Home / Self Care | Payer: Managed Care, Other (non HMO) | Attending: Obstetrics and Gynecology | Admitting: Obstetrics and Gynecology

## 2021-03-29 DIAGNOSIS — E559 Vitamin D deficiency, unspecified: Secondary | ICD-10-CM | POA: Diagnosis present

## 2021-03-29 DIAGNOSIS — O26893 Other specified pregnancy related conditions, third trimester: Secondary | ICD-10-CM | POA: Diagnosis present

## 2021-03-29 DIAGNOSIS — Z20822 Contact with and (suspected) exposure to covid-19: Secondary | ICD-10-CM | POA: Diagnosis present

## 2021-03-29 DIAGNOSIS — Z3A39 39 weeks gestation of pregnancy: Secondary | ICD-10-CM

## 2021-03-29 DIAGNOSIS — Z6791 Unspecified blood type, Rh negative: Secondary | ICD-10-CM

## 2021-03-29 DIAGNOSIS — O09899 Supervision of other high risk pregnancies, unspecified trimester: Secondary | ICD-10-CM

## 2021-03-29 HISTORY — DX: Urinary tract infection, site not specified: N39.0

## 2021-03-29 LAB — RESP PANEL BY RT-PCR (FLU A&B, COVID) ARPGX2
Influenza A by PCR: NEGATIVE
Influenza B by PCR: NEGATIVE
SARS Coronavirus 2 by RT PCR: NEGATIVE

## 2021-03-29 LAB — TYPE AND SCREEN
ABO/RH(D): O NEG
Antibody Screen: POSITIVE

## 2021-03-29 LAB — CBC
HCT: 33.9 % — ABNORMAL LOW (ref 36.0–46.0)
Hemoglobin: 11.1 g/dL — ABNORMAL LOW (ref 12.0–15.0)
MCH: 29.5 pg (ref 26.0–34.0)
MCHC: 32.7 g/dL (ref 30.0–36.0)
MCV: 90.2 fL (ref 80.0–100.0)
Platelets: 182 10*3/uL (ref 150–400)
RBC: 3.76 MIL/uL — ABNORMAL LOW (ref 3.87–5.11)
RDW: 12.7 % (ref 11.5–15.5)
WBC: 10.4 10*3/uL (ref 4.0–10.5)
nRBC: 0 % (ref 0.0–0.2)

## 2021-03-29 LAB — RPR: RPR Ser Ql: NONREACTIVE

## 2021-03-29 MED ORDER — EPHEDRINE 5 MG/ML INJ: 10.0000 mg | INTRAVENOUS | Status: DC | PRN

## 2021-03-29 MED ORDER — TERBUTALINE SULFATE 1 MG/ML IJ SOLN: 0.2500 mg | Freq: Once | INTRAMUSCULAR | Status: DC | PRN

## 2021-03-29 MED ORDER — LIDOCAINE HCL (PF) 1 % IJ SOLN
INTRAMUSCULAR | Status: DC | PRN
  Administered 2021-03-29: 8 mL via EPIDURAL

## 2021-03-29 MED ORDER — OXYCODONE-ACETAMINOPHEN 5-325 MG PO TABS: 2.0000 | ORAL_TABLET | ORAL | Status: DC | PRN

## 2021-03-29 MED ORDER — LIDOCAINE HCL (PF) 1 % IJ SOLN: 30.0000 mL | INTRAMUSCULAR | Status: DC | PRN

## 2021-03-29 MED ORDER — ACETAMINOPHEN 325 MG PO TABS: 650.0000 mg | ORAL_TABLET | ORAL | Status: DC | PRN

## 2021-03-29 MED ORDER — FENTANYL-BUPIVACAINE-NACL 0.5-0.125-0.9 MG/250ML-% EP SOLN
12.0000 mL/h | EPIDURAL | Status: DC | PRN
  Administered 2021-03-29: 22:00:00 12 mL/h via EPIDURAL
  Filled 2021-03-29: qty 250

## 2021-03-29 MED ORDER — LACTATED RINGERS IV SOLN: 500.0000 mL | Freq: Once | INTRAVENOUS | Status: DC

## 2021-03-29 MED ORDER — OXYCODONE-ACETAMINOPHEN 5-325 MG PO TABS: 1.0000 | ORAL_TABLET | ORAL | Status: DC | PRN

## 2021-03-29 MED ORDER — PHENYLEPHRINE 40 MCG/ML (10ML) SYRINGE FOR IV PUSH (FOR BLOOD PRESSURE SUPPORT): 80.0000 ug | PREFILLED_SYRINGE | INTRAVENOUS | Status: DC | PRN

## 2021-03-29 MED ORDER — SOD CITRATE-CITRIC ACID 500-334 MG/5ML PO SOLN
30.0000 mL | ORAL | Status: DC | PRN
  Administered 2021-03-29: 21:00:00 30 mL via ORAL
  Filled 2021-03-29: qty 30

## 2021-03-29 MED ORDER — FENTANYL-BUPIVACAINE-NACL 0.5-0.125-0.9 MG/250ML-% EP SOLN: 12.0000 mL/h | EPIDURAL | Status: DC | PRN

## 2021-03-29 MED ORDER — DIPHENHYDRAMINE HCL 50 MG/ML IJ SOLN: 12.5000 mg | INTRAMUSCULAR | Status: DC | PRN

## 2021-03-29 MED ORDER — OXYTOCIN BOLUS FROM INFUSION
333.0000 mL | Freq: Once | INTRAVENOUS | Status: AC
  Administered 2021-03-30: 03:00:00 333 mL via INTRAVENOUS

## 2021-03-29 MED ORDER — OXYTOCIN-SODIUM CHLORIDE 30-0.9 UT/500ML-% IV SOLN
1.0000 m[IU]/min | INTRAVENOUS | Status: DC
Start: 2021-03-29 — End: 2021-03-30
  Administered 2021-03-29 (×3): 2 m[IU]/min via INTRAVENOUS
  Filled 2021-03-29: qty 500

## 2021-03-29 MED ORDER — LACTATED RINGERS IV SOLN: INTRAVENOUS | Status: DC

## 2021-03-29 MED ORDER — LACTATED RINGERS IV SOLN
500.0000 mL | INTRAVENOUS | Status: DC | PRN
Start: 2021-03-29 — End: 2021-03-30

## 2021-03-29 MED ORDER — OXYTOCIN-SODIUM CHLORIDE 30-0.9 UT/500ML-% IV SOLN
2.5000 [IU]/h | INTRAVENOUS | Status: DC
  Filled 2021-03-29: qty 500

## 2021-03-29 MED ORDER — ONDANSETRON HCL 4 MG/2ML IJ SOLN
4.0000 mg | Freq: Four times a day (QID) | INTRAMUSCULAR | Status: DC | PRN
  Administered 2021-03-29: 23:00:00 4 mg via INTRAVENOUS
  Filled 2021-03-29: qty 2

## 2021-03-29 NOTE — Progress Notes (Signed)
° °  Donna Huang is a 20 y.o. G3P1011 at [redacted]w[redacted]d  admitted for eIOL  Subjective: Patient doing well; would like to eat breakfast.   Objective: Vitals:   03/29/21 1011 03/29/21 1035 03/29/21 1100 03/29/21 1101  BP: 111/72   93/64  Pulse: 94   98  Resp:      Temp:      TempSrc:      SpO2:  99% 99%   Weight:      Height:       No intake/output data recorded.  FHT:   140 bpm, mod var, present acel, no decels, no contraction UC:    SVE:   Dilation: 3 Effacement (%): 70 Station: -3, -2 Exam by:: jolynn Pitocin @ 4  mu/min  Labs: Lab Results  Component Value Date   WBC 10.4 03/29/2021   HGB 11.1 (L) 03/29/2021   HCT 33.9 (L) 03/29/2021   MCV 90.2 03/29/2021   PLT 182 03/29/2021    Assessment / Plan: Patient would like to stop pitocin and eat breakfast; RN made aware and will restart pitocin at 11 am  Labor:  early labor Fetal Wellbeing:  Category I Pain Control:   Not sure about epidural Anticipated MOD:  NSVD  Charlesetta Garibaldi Deeana Atwater 03/29/2021, 11:17 AM

## 2021-03-29 NOTE — MAU Note (Signed)
Pt here for induction of labor, elective at term. No problems with preg.  Denies pain, bleeding or LOF this morning.

## 2021-03-29 NOTE — H&P (Signed)
Donna Huang is a 20 y.o. female presenting for IOL 2/2 elective at term. OB History     Gravida  3   Para  1   Term  1   Preterm      AB  1   Living  1      SAB  1   IAB      Ectopic      Multiple  0   Live Births  1          Past Medical History:  Diagnosis Date   ADD (attention deficit disorder)    ADHD (attention deficit hyperactivity disorder)    Past Surgical History:  Procedure Laterality Date   KNEE SURGERY Left    TONSILLECTOMY     Family History: family history is not on file. Social History:  reports that she has never smoked. She has never used smokeless tobacco. She reports that she does not drink alcohol and does not use drugs.   Nursing Staff Provider  Office Location  [facility] Dating   8w [place]  Language  English Anatomy US   normal but incomplete>  Flu Vaccine  01/12/21 Genetic/Carrier Screen  NIPS: neg AFP: neg Horizon: neg  TDaP Vaccine   01/12/21 Hgb A1C or  GTT Early  Third trimester: nml   COVID Vaccine Done   LAB RESULTS   Rhogam  01/12/21 Blood Type --/--/O NEG (02/08 2123)   Baby Feeding Plan Breast Antibody NEG (02/08 2123)  Contraception Undecided Rubella 5.13 (05/27 1043)  Circumcision If boy yes RPR NON REACTIVE (06/02 0028)   Pediatrician  [facility] HBsAg NON-REACTIVE (05/27 1043)   Support Person Clifton Custard HCVAb Neg  Prenatal Classes  HIV Non Reactive (02/08 2123)     BTL Consent  GBS   (For PCN allergy, check sensitivities)   VBAC Consent  Pap <21       BP Cuff  Waterbirth  [ ]  Class [ ]  Consent [ ]  CNM visit  PHQ9 & GAD7 [  X] new OB [  ] 28 weeks  [  ] 36 weeks Induction  [ ]  Orders Entered [ ] Foley Y/N     Maternal Diabetes: No Genetic Screening: Normal Maternal Ultrasounds/Referrals: Normal Fetal Ultrasounds or other Referrals:  None Maternal Substance Abuse:  No Significant Maternal Medications:  None Significant Maternal Lab Results:  Group B Strep negative and Rh negative Other Comments:   None  Review of Systems  All other systems reviewed and are negative. Maternal Medical History:  Reason for admission: Elective IOL at term   Contractions: Frequency: rare.   Fetal activity: Perceived fetal activity is normal.   Last perceived fetal movement was within the past hour.   Prenatal complications: no prenatal complications Prenatal Complications - Diabetes: none.    Last menstrual period 06/17/2020, unknown if currently breastfeeding. Maternal Exam:  Pelvis: adequate for delivery.   Cervix: Cervix evaluated by digital exam.     Fetal Exam Fetal Monitor Review: Mode: ultrasound.   Baseline rate: 150.  Variability: moderate (6-25 bpm).   Pattern: accelerations present and no decelerations.   Fetal State Assessment: Category I - tracings are normal.  Physical Exam Vitals and nursing note reviewed.  Constitutional:      General: She is not in acute distress. HENT:     Head: Normocephalic.  Cardiovascular:     Rate and Rhythm: Normal rate.  Pulmonary:     Effort: Pulmonary effort is normal.  Abdominal:  Palpations: Abdomen is soft.     Tenderness: There is no abdominal tenderness.  Skin:    General: Skin is warm and dry.  Neurological:     Mental Status: She is alert and oriented to person, place, and time.  Psychiatric:        Mood and Affect: Mood normal.        Behavior: Behavior normal.    Prenatal labs: ABO, Rh: O/RH(D) NEGATIVE/-- (05/27 1043) Antibody: NO ANTIBODIES DETECTED (10/13 0844) Rubella: 5.13 (05/27 1043) RPR: NON-REACTIVE (10/13 0844)  HBsAg: NON-REACTIVE (05/27 1043)  HIV: NON-REACTIVE (10/13 0844)  GBS:   Negative   Assessment/Plan: 20 y.o. G3P1011 at [redacted]w[redacted]d  Admit to labor and delivery for elective IOL at term Cervical ripening and then pitocin as needed  GBS negative Planning epidural for pain  Considering Postplacental IUD  Breast feeding  Anticipate NSVD     Thressa Sheller DNP, CNM  03/29/21  6:38 AM

## 2021-03-29 NOTE — Progress Notes (Signed)
° °  Donna Huang is a 20 y.o. G3P1011 at [redacted]w[redacted]d  admitted for elective IOL  Subjective: Coping well, pain is 3/10  Objective: Vitals:   03/29/21 1303 03/29/21 1335 03/29/21 1423 03/29/21 1606  BP: 96/61 (!) 102/58 99/69 107/61  Pulse: 97 88 87 95  Resp:   18 18  Temp:    98.8 F (37.1 C)  TempSrc:    Oral  SpO2:      Weight:      Height:       No intake/output data recorded.  FHT:  FHR: 130 bpm, variability: moderate,  accelerations:  Present,  decelerations:  Absent UC:   uterine irratability SVE:   Dilation: 4 Effacement (%): 70 Station: -2 Exam by:: K. Crisoforo Oxford, CNM Pitocin @ 20 mu/min  Labs: Lab Results  Component Value Date   WBC 10.4 03/29/2021   HGB 11.1 (L) 03/29/2021   HCT 33.9 (L) 03/29/2021   MCV 90.2 03/29/2021   PLT 182 03/29/2021    Assessment / Plan: AROM with clear fluid at 1800 ; patient coping well.  Labor:  early labor Fetal Wellbeing:  Category I Pain Control:  Labor support without medications Anticipated MOD:  NSVD  Donna Huang 03/29/2021, 6:15 PM

## 2021-03-29 NOTE — Progress Notes (Signed)
Patient ID: Donna Huang, female   DOB: 07/23/2000, 20 y.o.   MRN: [id number]   Donna Huang is a 20 y.o. G3P1011 at [redacted]w[redacted]d  admitted for elective IOL  Subjective: Doing well, coping with contractions  Objective: Vitals:   03/29/21 1235 03/29/21 1303 03/29/21 1335 03/29/21 1423  BP: 108/72 96/61 (!) 102/58 99/69  Pulse: (!) 114 97 88 87  Resp:    18  Temp:      TempSrc:      SpO2:      Weight:      Height:       No intake/output data recorded.  FHT:  FHR: 130 bpm, variability: moderate,  accelerations:  Present,  decelerations:  Absent UC:   irregular, every minutes SVE:   Dilation: 4 Effacement (%): 70 Station: -2 Exam by:: Lorn Junes, RNC Pitocin @ 16 mu/min  Labs: Lab Results  Component Value Date   WBC 10.4 03/29/2021   HGB 11.1 (L) 03/29/2021   HCT 33.9 (L) 03/29/2021   MCV 90.2 03/29/2021   PLT 182 03/29/2021    Assessment / Plan: Doing well; no complaints.   Labor:  still in early labor; consider AROM if no change in 4 hours Fetal Wellbeing:  Category I Pain Control:  Labor support without medications Anticipated MOD:  NSVD  [name] 03/29/2021, 3:16 PM

## 2021-03-29 NOTE — Anesthesia Preprocedure Evaluation (Signed)
Anesthesia Evaluation  Patient identified by MRN, date of birth, ID band Patient awake    Reviewed: Allergy & Precautions, H&P , NPO status , Patient's Chart, lab work & pertinent test results, reviewed documented beta blocker date and time   Airway Mallampati: II  TM Distance: >3 FB Neck ROM: full    Dental no notable dental hx. (+) Teeth Intact, Dental Advisory Given   Pulmonary neg pulmonary ROS,    Pulmonary exam normal breath sounds clear to auscultation       Cardiovascular negative cardio ROS Normal cardiovascular exam Rhythm:regular Rate:Normal     Neuro/Psych negative neurological ROS  negative psych ROS   GI/Hepatic negative GI ROS, Neg liver ROS,   Endo/Other  negative endocrine ROS  Renal/GU negative Renal ROS  negative genitourinary   Musculoskeletal   Abdominal   Peds  Hematology negative hematology ROS (+)   Anesthesia Other Findings   Reproductive/Obstetrics (+) Pregnancy                             Anesthesia Physical Anesthesia Plan  ASA: 2  Anesthesia Plan: Epidural   Post-op Pain Management:    Induction:   PONV Risk Score and Plan: 2 and Ondansetron and Treatment may vary due to age or medical condition  Airway Management Planned: Natural Airway  Additional Equipment:   Intra-op Plan:   Post-operative Plan:   Informed Consent: I have reviewed the patients History and Physical, chart, labs and discussed the procedure including the risks, benefits and alternatives for the proposed anesthesia with the patient or authorized representative who has indicated his/her understanding and acceptance.       Plan Discussed with: Anesthesiologist  Anesthesia Plan Comments:         Anesthesia Quick Evaluation

## 2021-03-29 NOTE — Anesthesia Procedure Notes (Signed)
Epidural Patient location during procedure: OB Start time: 03/29/2021 10:03 PM End time: 03/29/2021 10:10 PM  Staffing Anesthesiologist: Bethena Midget, MD  Preanesthetic Checklist Completed: patient identified, IV checked, site marked, risks and benefits discussed, surgical consent, monitors and equipment checked, pre-op evaluation and timeout performed  Epidural Patient position: sitting Prep: DuraPrep and site prepped and draped Patient monitoring: continuous pulse ox and blood pressure Approach: midline Location: L3-L4 Injection technique: LOR air  Needle:  Needle type: Tuohy  Needle gauge: 17 G Needle length: 9 cm and 9 Needle insertion depth: 7 cm Catheter type: closed end flexible Catheter size: 19 Gauge Catheter at skin depth: 13 cm Test dose: negative  Assessment Events: blood not aspirated, injection not painful, no injection resistance, no paresthesia and negative IV test

## 2021-03-30 ENCOUNTER — Encounter (HOSPITAL_COMMUNITY): Payer: Self-pay | Admitting: Family Medicine

## 2021-03-30 DIAGNOSIS — Z3A39 39 weeks gestation of pregnancy: Secondary | ICD-10-CM

## 2021-03-30 MED ORDER — WITCH HAZEL-GLYCERIN EX PADS: 1.0000 "application " | MEDICATED_PAD | CUTANEOUS | Status: DC | PRN

## 2021-03-30 MED ORDER — SODIUM CHLORIDE 0.9 % IV SOLN: 250.0000 mL | INTRAVENOUS | Status: DC | PRN

## 2021-03-30 MED ORDER — ZOLPIDEM TARTRATE 5 MG PO TABS: 5.0000 mg | ORAL_TABLET | Freq: Every evening | ORAL | Status: DC | PRN

## 2021-03-30 MED ORDER — DIBUCAINE (PERIANAL) 1 % EX OINT: 1.0000 "application " | TOPICAL_OINTMENT | CUTANEOUS | Status: DC | PRN

## 2021-03-30 MED ORDER — PRENATAL MULTIVITAMIN CH
1.0000 | ORAL_TABLET | Freq: Every day | ORAL | Status: DC
  Administered 2021-03-30 – 2021-03-31 (×2): 1 via ORAL
  Filled 2021-03-30 (×2): qty 1

## 2021-03-30 MED ORDER — ONDANSETRON HCL 4 MG PO TABS: 4.0000 mg | ORAL_TABLET | ORAL | Status: DC | PRN

## 2021-03-30 MED ORDER — COCONUT OIL OIL
1.0000 "application " | TOPICAL_OIL | Status: DC | PRN
  Administered 2021-03-30: 1 via TOPICAL

## 2021-03-30 MED ORDER — SODIUM CHLORIDE 0.9% FLUSH: 3.0000 mL | INTRAVENOUS | Status: DC | PRN

## 2021-03-30 MED ORDER — SIMETHICONE 80 MG PO CHEW: 80.0000 mg | CHEWABLE_TABLET | ORAL | Status: DC | PRN

## 2021-03-30 MED ORDER — SODIUM CHLORIDE 0.9% FLUSH
3.0000 mL | Freq: Two times a day (BID) | INTRAVENOUS | Status: DC
  Administered 2021-03-30: 10:00:00 3 mL via INTRAVENOUS

## 2021-03-30 MED ORDER — DIPHENHYDRAMINE HCL 25 MG PO CAPS: 25.0000 mg | ORAL_CAPSULE | Freq: Four times a day (QID) | ORAL | Status: DC | PRN

## 2021-03-30 MED ORDER — IBUPROFEN 600 MG PO TABS
600.0000 mg | ORAL_TABLET | Freq: Four times a day (QID) | ORAL | Status: DC
  Administered 2021-03-30 – 2021-03-31 (×6): 600 mg via ORAL
  Filled 2021-03-30 (×6): qty 1

## 2021-03-30 MED ORDER — SENNOSIDES-DOCUSATE SODIUM 8.6-50 MG PO TABS
2.0000 | ORAL_TABLET | ORAL | Status: DC
  Administered 2021-03-30 – 2021-03-31 (×2): 2 via ORAL
  Filled 2021-03-30 (×2): qty 2

## 2021-03-30 MED ORDER — ONDANSETRON HCL 4 MG/2ML IJ SOLN: 4.0000 mg | INTRAMUSCULAR | Status: DC | PRN

## 2021-03-30 MED ORDER — TETANUS-DIPHTH-ACELL PERTUSSIS 5-2.5-18.5 LF-MCG/0.5 IM SUSY: 0.5000 mL | PREFILLED_SYRINGE | Freq: Once | INTRAMUSCULAR | Status: DC

## 2021-03-30 MED ORDER — MEASLES, MUMPS & RUBELLA VAC IJ SOLR: 0.5000 mL | Freq: Once | INTRAMUSCULAR | Status: DC

## 2021-03-30 MED ORDER — ACETAMINOPHEN 325 MG PO TABS
650.0000 mg | ORAL_TABLET | ORAL | Status: DC | PRN
  Administered 2021-03-30: 10:00:00 650 mg via ORAL
  Filled 2021-03-30: qty 2

## 2021-03-30 MED ORDER — BENZOCAINE-MENTHOL 20-0.5 % EX AERO: 1.0000 "application " | INHALATION_SPRAY | CUTANEOUS | Status: DC | PRN

## 2021-03-30 NOTE — Lactation Note (Signed)
This note was copied from a baby's chart. Lactation Consultation Note  Patient Name: Donna Huang ZESPQ'[id number] Date: 03/30/2021 Reason for consult: Initial assessment;Term Age:21 hours   Lactation Initial Consult:  Mother declined lactation in labor and delivery.  She is a P2 and breast fed her first child for one month.   Mother had baby "[name]" latched in the cradle hold when I arrived.  Baby was swaddled. "[name]" had a wide gape and flanged lips; shallow latch.  Advised mother how to obtain a deeper latch and also suggested feeding STS. Mother denied pain with latching. Observed her feeding for an additional 5 minutes before "[name]" self released.  Reviewed breast feeding basics.  Encouraged to call her RN/LC for latch assistance as needed.  Father present and supportive.  All questions answered.  RN updated.   Maternal Data Has patient been taught Hand Expression?: Yes Does the patient have breastfeeding experience prior to this delivery?: Yes How long did the patient breastfeed?: 1 month  Feeding Mother's Current Feeding Choice: Breast Milk  LATCH Score Latch: Grasps breast easily, tongue down, lips flanged, rhythmical sucking.  Audible Swallowing: A few with stimulation  Type of Nipple: Everted at rest and after stimulation  Comfort (Breast/Nipple): Soft / non-tender  Hold (Positioning): Assistance needed to correctly position infant at breast and maintain latch.  LATCH Score: 8   Lactation Tools Discussed/Used    Interventions Interventions: Breast feeding basics reviewed;Skin to skin;Position options;Support pillows;Education;LC Services brochure  Discharge Pump: Personal Conservation officer, nature) WIC Program: No  Consult Status Consult Status: Follow-up Date: 03/31/21 Follow-up type: In-patient    Daiton Cowles R Nealie Mchatton 03/30/2021, 4:55 AM

## 2021-03-30 NOTE — Anesthesia Postprocedure Evaluation (Signed)
Anesthesia Post Note  Patient: Donna Huang  Procedure(s) Performed: AN AD HOC LABOR EPIDURAL     Patient location during evaluation: Mother Baby Anesthesia Type: Epidural Level of consciousness: awake Pain management: satisfactory to patient Vital Signs Assessment: post-procedure vital signs reviewed and stable Respiratory status: spontaneous breathing Cardiovascular status: stable Anesthetic complications: no   No notable events documented.  Last Vitals:  Vitals:   03/30/21 0522 03/30/21 1332  BP: (!) 92/58 (!) 99/49  Pulse: 81 73  Resp: 18 20  Temp: 36.8 C 36.8 C  SpO2: 98% 100%    Last Pain:  Vitals:   03/30/21 1429  TempSrc:   PainSc: 0-No pain   Pain Goal:                   [employer]

## 2021-03-30 NOTE — Discharge Summary (Signed)
Postpartum Discharge Summary  Date of Service updated     Patient Name: Donna Huang DOB: [birth year] MRN: [id number]  Date of admission: 03/29/2021 Delivery date:03/30/2021  Delivering provider: Patriciaann Clan  Date of discharge: 03/31/2021  Admitting diagnosis: Normal labor [O80, Z37.9] Intrauterine pregnancy: [redacted]w[redacted]d    Secondary diagnosis:  Principal Problem:   Normal labor Active Problems:   Rh negative status during pregnancy   Vitamin D deficiency  Additional problems: NA   Discharge diagnosis: Term Pregnancy Delivered                                              Post partum procedures: NA Baby blood type is Rh negative  Augmentation: AROM and Pitocin Complications: None  Hospital course: Induction of Labor With Vaginal Delivery   20y.o. yo G3P1011 at 313w4das admitted to the hospital 03/29/2021 for induction of labor.  Indication for induction: Favorable cervix at term and Elective.  Patient had an uncomplicated labor course as follows: Membrane Rupture Time/Date: 5:59 PM ,03/29/2021   Delivery Method:Vaginal, Spontaneous  Episiotomy: None  Lacerations:  None  Details of delivery can be found in separate delivery note.  Patient had a routine postpartum course. Patient is discharged home 03/31/21.  Newborn Data: Birth date:03/30/2021  Birth time:2:40 AM  Gender:Female  Living status:Living  Apgars:8 ,9  Weight:2910 g   Magnesium Sulfate received: No BMZ received: No Rhophylac:No MMR:No T-DaP:Given prenatally Flu: offer before discharge  Transfusion:No  Physical exam  Vitals:   03/30/21 0522 03/30/21 1332 03/30/21 1724 03/31/21 0554  BP: (!) 92/58 (!) 99/49 102/66 (!) 104/58  Pulse: 81 73 72 68  Resp: _0 Temp: 98.2 F (36.8 C) 98.3 F (36.8 C) 97.6 F (36.4 C) 98.4 F (36.9 C)  TempSrc: Oral Oral Oral Oral  SpO2: 98% 100% 99% 100%  Weight:      Height:       General: alert, cooperative, and no distress Lochia:  appropriate Uterine Fundus: firm Incision: N/A DVT Evaluation: No evidence of DVT seen on physical exam. Labs: Lab Results  Component Value Date   WBC 10.4 03/29/2021   HGB 11.1 (L) 03/29/2021   HCT 33.9 (L) 03/29/2021   MCV 90.2 03/29/2021   PLT 182 03/29/2021   No flowsheet data found. Edinburgh Score: Edinburgh Postnatal Depression Scale Screening Tool 03/30/2021  I have been able to laugh and see the funny side of things. 0  I have looked forward with enjoyment to things. 0  I have blamed myself unnecessarily when things went wrong. 0  I have been anxious or worried for no good reason. 0  I have felt scared or panicky for no good reason. 0  Things have been getting on top of me. 1  I have been so unhappy that I have had difficulty sleeping. 0  I have felt sad or miserable. 0  I have been so unhappy that I have been crying. 0  The thought of harming myself has occurred to me. 0  Edinburgh Postnatal Depression Scale Total 1     After visit meds:  Allergies as of 03/31/2021   No Known Allergies      Medication List     TAKE these medications    cyclobenzaprine 5 MG tablet Commonly known as: FLEXERIL Take 1 tablet (5 mg total)  by mouth as needed for muscle spasms (low back pain).   DICLEGIS PO Take by mouth at bedtime as needed.   docusate sodium 100 MG capsule Commonly known as: COLACE Take 100 mg by mouth 2 (two) times daily.   famotidine 20 MG tablet Commonly known as: PEPCID TAKE 1 TABLET BY MOUTH AT BEDTIME   PRENATAL VITAMINS PO Take 1 capsule by mouth daily.         Discharge home in stable condition Infant Feeding: Breast Infant Disposition:home with mother Discharge instruction: per After Visit Summary and Postpartum booklet. Activity: Advance as tolerated. Pelvic rest for 6 weeks.  Diet: routine diet Future Appointments: Future Appointments  Date Time Provider [place]  04/28/2021  8:30 AM Julianne Handler, CNM [facility]  [facility]   Follow up Visit:  Message sent by Dr Higinio Plan to Jule Ser:  Please schedule this patient for a In person postpartum visit in 6 weeks with the following provider: Any provider. Additional Postpartum F/U: None   Low risk pregnancy complicated by:  RH Neg Delivery mode:  Vaginal, Spontaneous  Anticipated Birth Control:  Unsure   03/31/2021 Starr Lake, CNM

## 2021-03-30 NOTE — Progress Notes (Signed)
Labor Progress Note   Became more uncomfortable with contractions, now s/p epidural. RN checked: 6/70/-2 with contractions every 2 minutes.   FHT Cat I. Will cont to monitor and check in the next 4 hours or sooner if needed.   Allayne Stack, DO

## 2021-03-31 NOTE — Lactation Note (Signed)
This note was copied from a baby's chart. Lactation Consultation Note  Patient Name: Donna Huang LDJTT'S Date: 03/31/2021 Reason for consult: Follow-up assessment Age:21 hours Feeding Mother's Current Feeding Choice: Breast Milk  P2, Mother denies questions or concerns. Reviewed engorgement care and monitoring voids/stools. Pacifier use not recommended at this time.    LATCH Score Latch: Grasps breast easily, tongue down, lips flanged, rhythmical sucking.  Audible Swallowing: A few with stimulation  Type of Nipple: Everted at rest and after stimulation  Comfort (Breast/Nipple): Soft / non-tender  Hold (Positioning): No assistance needed to correctly position infant at breast.  LATCH Score: 9   Interventions Interventions: Breast feeding basics reviewed;Education  Discharge Discharge Education: Engorgement and breast care;Warning signs for feeding baby  Consult Status Consult Status: Complete Date: 03/31/21    Dahlia Byes [facility] 03/31/2021, 12:28 PM

## 2021-04-07 ENCOUNTER — Encounter (INDEPENDENT_AMBULATORY_CARE_PROVIDER_SITE_OTHER): Payer: Self-pay

## 2021-04-10 ENCOUNTER — Encounter: Payer: Self-pay | Admitting: Obstetrics and Gynecology

## 2021-04-11 ENCOUNTER — Telehealth (HOSPITAL_COMMUNITY): Payer: Self-pay | Admitting: *Deleted

## 2021-04-11 NOTE — Telephone Encounter (Signed)
Attempted hospital discharge follow-up call. Left message for patient to return RN call. Erline Levine, RN, 04/11/21, [phone]

## 2021-04-28 ENCOUNTER — Other Ambulatory Visit (HOSPITAL_COMMUNITY)
Admission: RE | Admit: 2021-04-28 | Discharge: 2021-04-28 | Disposition: A | Discharge status: Home / Self Care | Payer: Managed Care, Other (non HMO) | Source: Ambulatory Visit | Attending: Certified Nurse Midwife | Admitting: Certified Nurse Midwife

## 2021-04-28 ENCOUNTER — Encounter: Payer: Self-pay | Admitting: Certified Nurse Midwife

## 2021-04-28 ENCOUNTER — Ambulatory Visit (INDEPENDENT_AMBULATORY_CARE_PROVIDER_SITE_OTHER): Payer: Managed Care, Other (non HMO) | Admitting: Certified Nurse Midwife

## 2021-04-28 ENCOUNTER — Other Ambulatory Visit: Payer: Self-pay

## 2021-04-28 VITALS — BP 97/68 | HR 75 | Ht 63.0 in | Wt 171.0 lb

## 2021-04-28 DIAGNOSIS — N898 Other specified noninflammatory disorders of vagina: Secondary | ICD-10-CM | POA: Insufficient documentation

## 2021-04-28 DIAGNOSIS — Z124 Encounter for screening for malignant neoplasm of cervix: Secondary | ICD-10-CM | POA: Insufficient documentation

## 2021-04-28 NOTE — Progress Notes (Signed)
Post Partum Visit Note  Donna Huang is a 21 y.o. [phone] female who presents for a postpartum visit. She is 4 weeks postpartum following a normal spontaneous vaginal delivery.  I have fully reviewed the prenatal and intrapartum course. The delivery was at 39.4 gestational weeks.  Anesthesia: epidural. Postpartum course has been unremarkable. Baby is doing well. Baby is feeding by breast. Bleeding staining only. Bowel function is normal. Bladder function is normal. Patient is not sexually active. Contraception method is none. Postpartum depression screening: negative.  Reports yellow malodorous vaginal discharge  Reports severe abdominal cramping after her first meal of the day. Usually eats just before noon. Doesn't feel hungry first thing in the am.  The pregnancy intention screening data noted above was reviewed. Potential methods of contraception were discussed. The patient elected to proceed with No data recorded.   Edinburgh Postnatal Depression Scale - 04/28/21 0828       Edinburgh Postnatal Depression Scale:  In the Past 7 Days   I have been able to laugh and see the funny side of things. 0    I have looked forward with enjoyment to things. 0    I have blamed myself unnecessarily when things went wrong. 0    I have been anxious or worried for no good reason. 0    I have felt scared or panicky for no good reason. 0    Things have been getting on top of me. 0    I have been so unhappy that I have had difficulty sleeping. 0    I have felt sad or miserable. 0    I have been so unhappy that I have been crying. 0    The thought of harming myself has occurred to me. 0    Edinburgh Postnatal Depression Scale Total 0            Health Maintenance Due  Topic Date Due   HPV VACCINES (1 - 2-dose series) Never done   COVID-19 Vaccine (2 - Pfizer series) 01/27/2020   PAP-Cervical Cytology Screening  Never done   PAP SMEAR-Modifier  Never done    The following portions of  the patient's history were reviewed and updated as appropriate: allergies, current medications, past family history, past medical history, past social history, past surgical history, and problem list.  Review of Systems Pertinent items are noted in HPI. +vaginal discharge Objective:  BP 97/68    Pulse 75    Ht 5\' 3"  (1.6 m)    Wt 171 lb (77.6 kg)    Breastfeeding Yes    BMI 30.29 kg/m    General:  alert, cooperative, and no distress   Breasts:  not indicated  Lungs: NWOB  Heart:  Reg rate  Abdomen: N/a    Wound N/a  GU exam:  normal       Assessment:   1. Screening for cervical cancer   2. Vaginal discharge   3. Postpartum exam     Plan:   Essential components of care per ACOG recommendations:  1.  Mood and well being: Patient with negative depression screening today. Reviewed local resources for support.  - Patient tobacco use? No.   - hx of drug use? No.    2. Infant care and feeding:  -Patient currently breastmilk feeding? Yes. Discussed returning to work and pumping.  -Social determinants of health ([place]) reviewed in [place]. No concerns  3. Sexuality, contraception and birth spacing - Patient does not want a  pregnancy in the next year.  Desired family size is unknown  - Reviewed forms of contraception in tiered fashion. Patient  undecided  today.   - Discussed birth spacing of 18 months - She will call back with her choice-might be leaning toward POPs  4. Sleep and fatigue -Encouraged family/partner/community support of 4 hrs of uninterrupted sleep to help with mood and fatigue  5. Physical Recovery  - Discussed patients delivery and complications. She describes her labor as good. - Patient had a Vaginal, no problems at delivery. Patient had a  no  laceration. Perineal healing reviewed. Patient expressed understanding - Patient has urinary incontinence? No. - Patient is safe to resume physical and sexual activity  6.  Health Maintenance - HM due items addressed  Yes - Last pap smear No results found for: DIAGPAP Pap smear done at today's visit.  -Breast Cancer screening indicated? No.   7. Chronic Disease/Pregnancy Condition follow up: None  - PCP follow up  8. Abdominal cramping -recommend eating something small and bland first thing in the am -unclear etiology of symptoms -if doesn't improve f/u with PCP  Julianne Handler, [place] for [employer], [place]

## 2021-05-01 LAB — CERVICOVAGINAL ANCILLARY ONLY
Bacterial Vaginitis (gardnerella): NEGATIVE
Candida Glabrata: NEGATIVE
Candida Vaginitis: NEGATIVE
Comment: NEGATIVE
Comment: NEGATIVE
Comment: NEGATIVE

## 2021-05-01 LAB — CYTOLOGY - PAP: Diagnosis: NEGATIVE

## 2021-05-24 ENCOUNTER — Encounter: Payer: Self-pay | Admitting: *Deleted

## 2022-08-24 IMAGING — US US OB < 14 WEEKS - US OB TV
1 series · 14 of 28 positions shown · non-contrast
Comparison: May 10, 2020

CLINICAL DATA: Early OB dating.

EXAM:
OBSTETRIC <14 WK US AND TRANSVAGINAL OB US
TECHNIQUE: Both transabdominal and transvaginal ultrasound examinations were
performed for complete evaluation of the gestation as well as the
maternal uterus, adnexal regions, and pelvic cul-de-sac.
Transvaginal technique was performed to assess early pregnancy.

[Series 1: us ob less than 14 weeks with ob transvaginal · 14 of 109 slices shown]
[im 5/109]
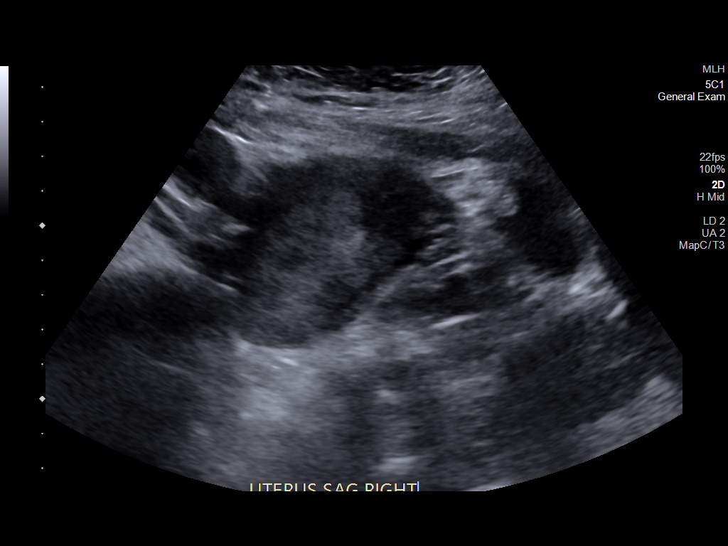
[im 13/109]
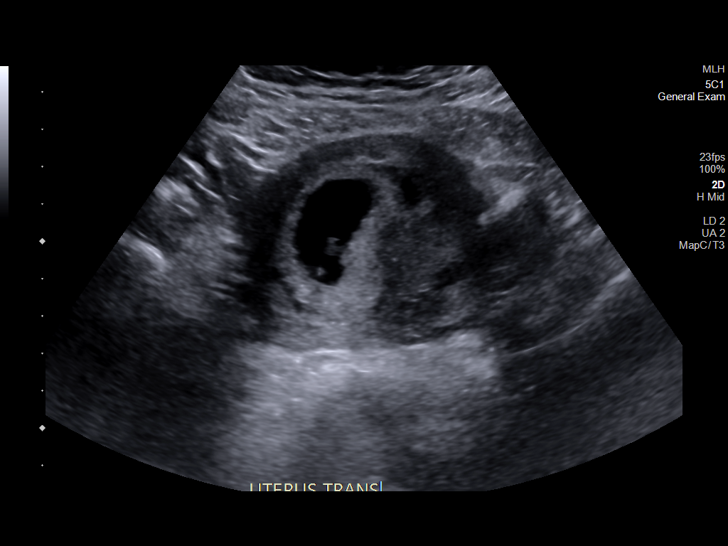
[im 21/109]
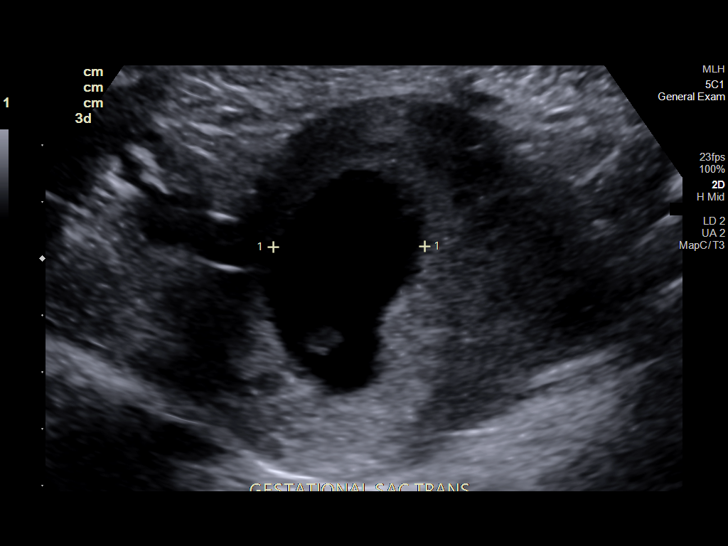
[im 29/109]
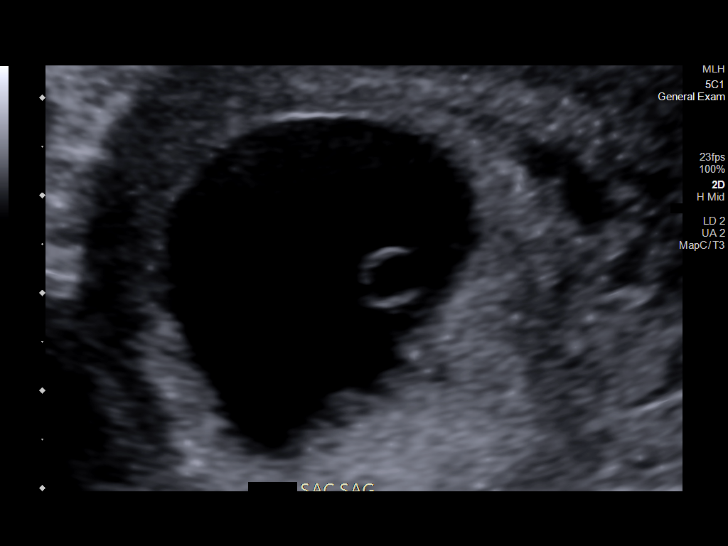
[im 37/109]
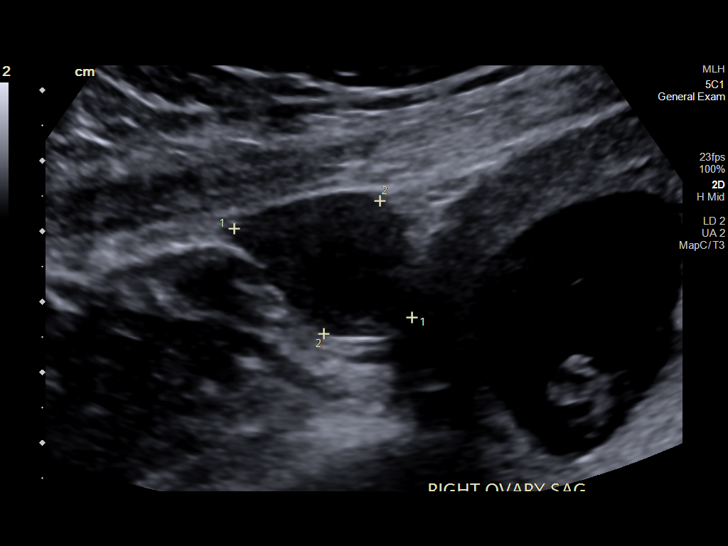
[im 45/109]
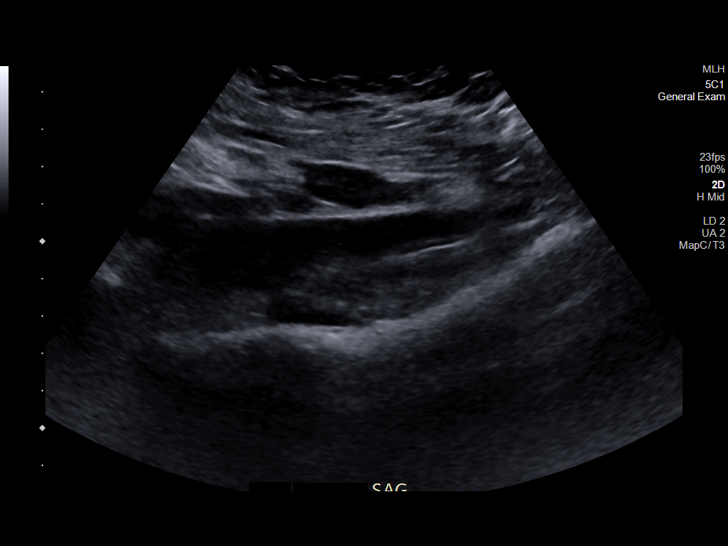
[im 53/109]
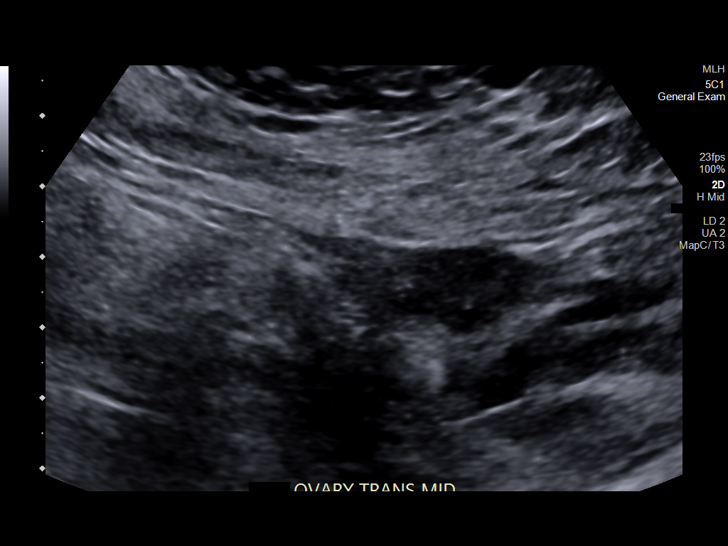
[im 61/109]
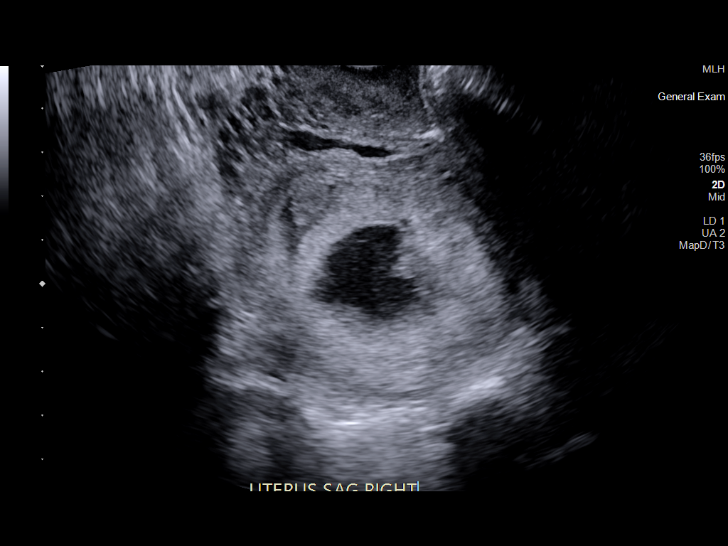
[im 69/109]
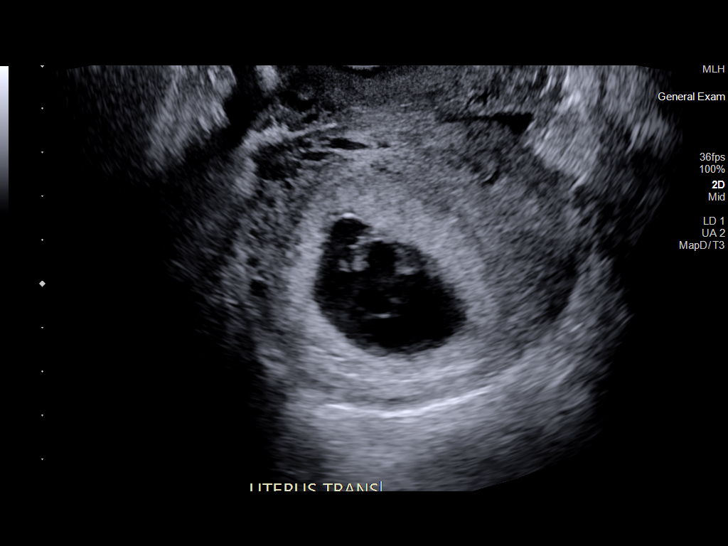
[im 77/109]
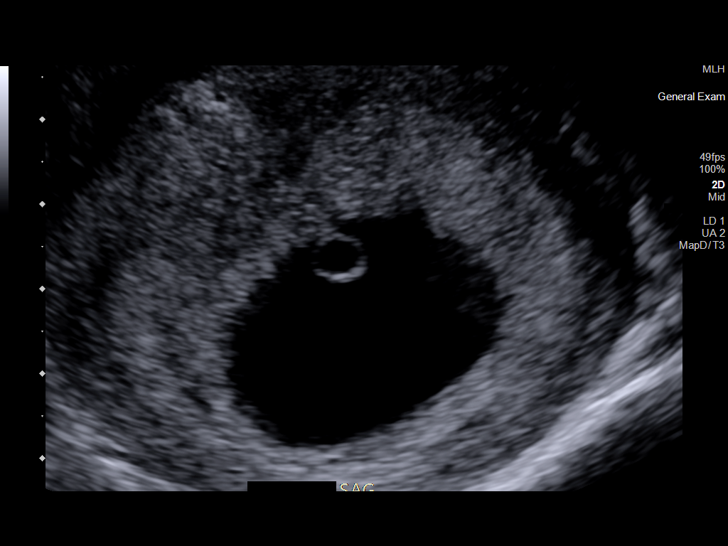
[im 85/109]
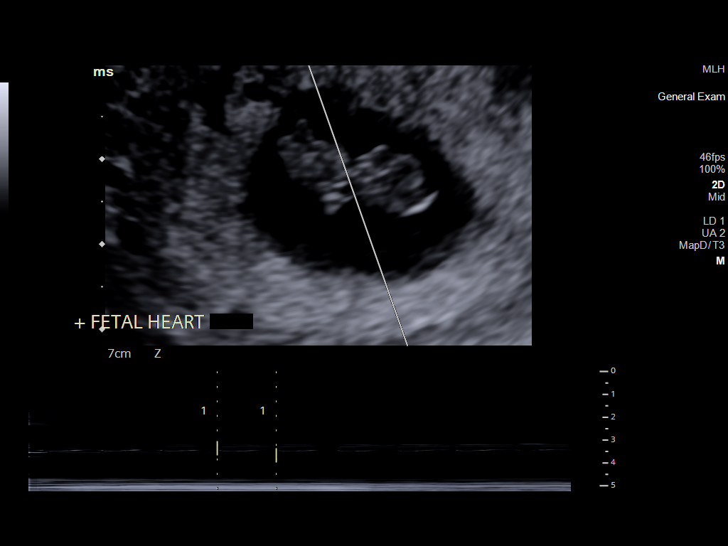
[im 93/109]
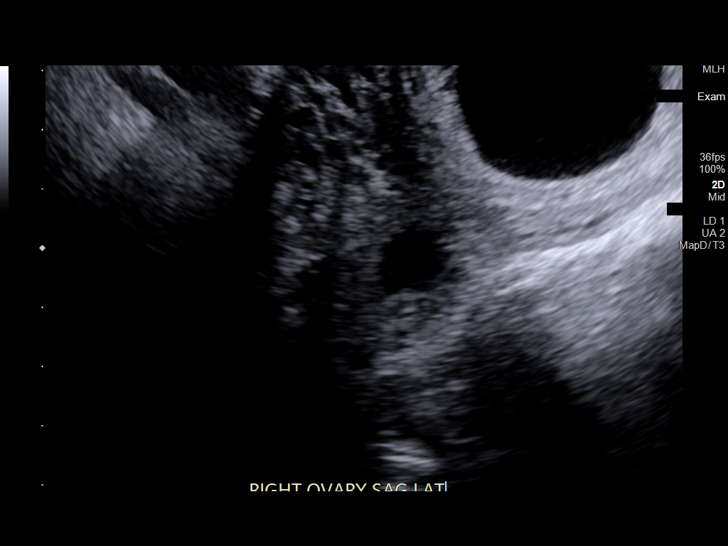
[im 101/109]
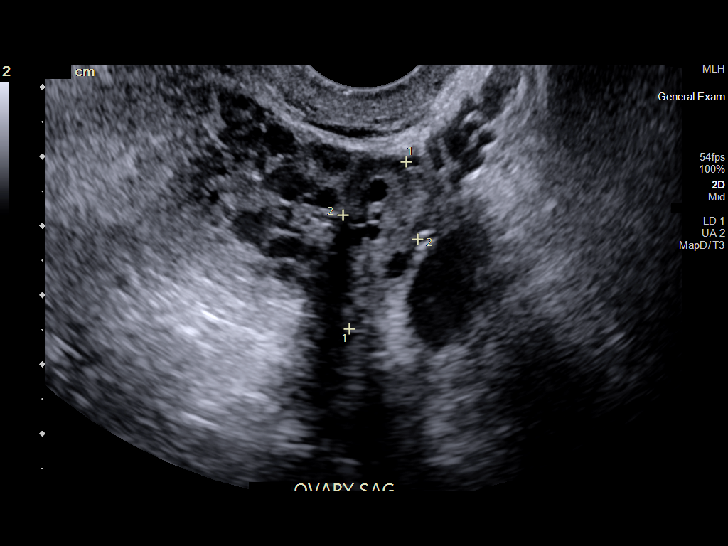
[im 109/109]
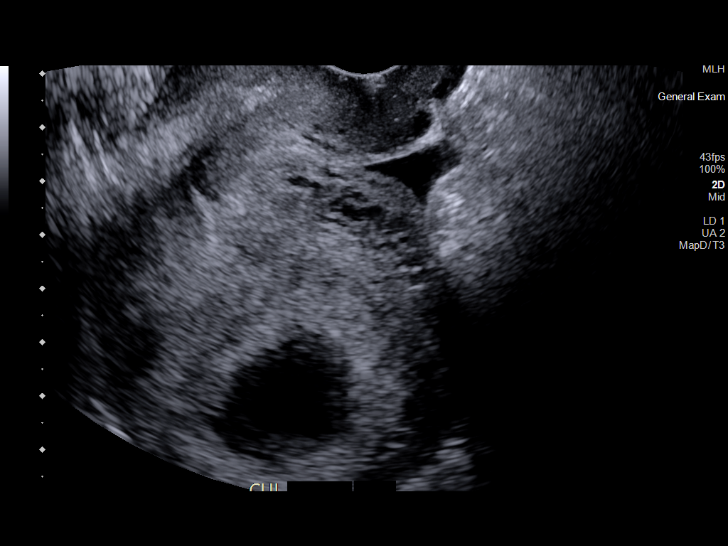

[14 of 28 positions shown; findings below may reference images not displayed]

FINDINGS: Intrauterine gestational sac: Single

Yolk sac:  Visualized.

Embryo:  Visualized.

Cardiac Activity: Visualized.

Heart Rate: 173 bpm

MSD: 34.5 mm   8 w   5 d

CRL:  19.8 mm   8 w   4 d                  US EDC: April 02, 2021

Subchorionic hemorrhage:  Small

Maternal uterus/adnexae: Both ovaries are visualized and are normal
in appearance.

A trace amount of pelvic free fluid is seen.
IMPRESSION: 1. Single, viable intrauterine pregnancy at approximately 8 weeks
and 4 days gestation by ultrasound evaluation.

## 2023-11-02 ENCOUNTER — Ambulatory Visit
Admission: RE | Admit: 2023-11-02 | Discharge: 2023-11-02 | Disposition: A | Discharge status: Home / Self Care | Source: Ambulatory Visit | Attending: Family Medicine | Admitting: Family Medicine

## 2023-11-02 VITALS — BP 108/74 | HR 73 | Temp 99.3°F | Resp 18 | Ht 63.0 in | Wt 190.0 lb

## 2023-11-02 DIAGNOSIS — L03115 Cellulitis of right lower limb: Secondary | ICD-10-CM

## 2023-11-02 MED ORDER — SULFAMETHOXAZOLE-TRIMETHOPRIM 800-160 MG PO TABS: 1.0000 | ORAL_TABLET | Freq: Two times a day (BID) | ORAL | 0 refills | Status: AC

## 2023-11-02 NOTE — ED Triage Notes (Signed)
 Patient c/o a bump on the inside of her right thigh x 3 days.  Patient thought it was an ingrown hair, tried to pop, some fluid came out.  It has increased in size, redness around the area, tender to touch.  Patient has applied triple antibiotic ointment to the area.

## 2023-11-02 NOTE — Discharge Instructions (Signed)
 Take antibiotic 2 times a day.  Take 2 doses today. Use warm compresses to area Call or return for problems

## 2023-11-02 NOTE — ED Provider Notes (Signed)
 Donna Huang    CSN: [id number] Arrival date & time: 11/02/23  1152      History   Chief Complaint Chief Complaint  Patient presents with   Blister    Bump on my leg that is warm and oozing - Entered by patient    HPI Donna Huang is a 23 y.o. female.   HPI  Patient is a right thigh for 3 days.  She states it happened after shaving.  She thought it was a hair bump.  It is larger, redder, and more painful over time.  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 Authorizing Provider  Prenatal Vit-Fe Fumarate-FA (PRENATAL VITAMINS PO) Take 1 capsule by mouth daily.   Yes [provider]  sulfamethoxazole -trimethoprim  (BACTRIM  DS) 800-160 MG tablet Take 1 tablet by mouth 2 (two) times daily for 7 days. 11/02/23 11/09/23 Yes Maranda Jamee Jacob, MD    Family History Family History  Problem Relation Age of Onset   Healthy Mother    Healthy Father     Social History Social History   Tobacco Use   Smoking status: Never   Smokeless tobacco: Never  Vaping Use   Vaping status: Never Used  Substance Use Topics   Alcohol use: No   Drug use: No     Allergies   Patient has no known allergies.   Review of Systems Review of Systems See  HPI  Physical Exam Triage Vital Signs ED Triage Vitals  Encounter Vitals Group     BP 11/02/23 1219 108/74     Girls Systolic BP Percentile --      Girls Diastolic BP Percentile --      Boys Systolic BP Percentile --      Boys Diastolic BP Percentile --      Pulse Rate 11/02/23 1219 73     Resp 11/02/23 1219 18     Temp 11/02/23 1219 99.3 F (37.4 C)     Temp Source 11/02/23 1219 Oral     SpO2 11/02/23 1219 97 %     Weight 11/02/23 1221 190 lb (86.2 kg)     Height 11/02/23 1221 5' 3 (1.6 m)     Head Circumference --      Peak Flow --      Pain Score 11/02/23 1220 0     Pain Loc --      Pain Education --      Exclude from Growth Chart --  No data found.  Updated Vital Signs BP 108/74 (BP Location: Right Arm)   Pulse 73   Temp 99.3 F (37.4 C) (Oral)   Resp 18   Ht 5' 3 (1.6 m)   Wt 86.2 kg   LMP 11/01/2023 (Exact Date)   SpO2 97%   Breastfeeding Yes   BMI 33.66 kg/m      Physical Exam Vitals reviewed.  Constitutional:      General: She is not in acute distress.    Appearance: She is well-developed.  HENT:     Head: Normocephalic and atraumatic.  Eyes:     Conjunctiva/sclera: Conjunctivae normal.     Pupils: Pupils are equal, round, and reactive to light.  Cardiovascular:     Rate and Rhythm: Normal rate.  Pulmonary:     Effort: Pulmonary effort is normal. No respiratory distress.  Musculoskeletal:        General: Normal range of motion.     Cervical back: Normal range of motion.  Skin:    General: Skin is warm and dry.     Findings: Erythema present.     Comments: In the midportion of the medial right thigh there is a 10 cm area of erythema with a pinpoint pustule in the center.  No fluctuance  Neurological:     Mental Status: She is alert.      UC Treatments / Results  Labs (all labs ordered are listed, but only abnormal results are displayed) Labs Reviewed - No data to display  EKG   Radiology No results  found.  Procedures Procedures (including critical Huang time)  Medications Ordered in UC Medications - No data to display  Initial Impression / Assessment and Plan / UC Course  I have reviewed the triage vital signs and the nursing notes.  Pertinent labs & imaging results that were available during my Huang of the patient were reviewed by me and considered in my medical decision making (see chart for details).     Patient is breast-feeding.  I did consult with the NIH database for use of drugs during lactation Final Clinical Impressions(s) / UC Diagnoses   Final diagnoses:  Cellulitis of leg, right     Discharge Instructions      Take antibiotic 2 times a day.  Take 2 doses today. Use warm compresses to area Call or return for problems   ED Prescriptions     Medication Sig Dispense Auth. Provider   sulfamethoxazole -trimethoprim  (BACTRIM  DS) 800-160 MG tablet Take 1 tablet by mouth 2 (two) times daily for 7 days. 14 tablet Maranda Jamee Jacob, MD      PDMP not reviewed this encounter.   Maranda Jamee Jacob, MD 11/02/23 [phone]
